# Patient Record
Sex: Male | Born: 1950 | Race: White | Hispanic: No | Smoking: Current every day smoker
Health system: Southern US, Community
[De-identification: ages and names within clinical notes are randomized; demographics above are authoritative.]

## PROBLEM LIST (undated history)

## (undated) DIAGNOSIS — Z86010 Personal history of colonic polyps: Secondary | ICD-10-CM

## (undated) DIAGNOSIS — Z860101 Personal history of adenomatous and serrated colon polyps: Secondary | ICD-10-CM

## (undated) DIAGNOSIS — S022XXA Fracture of nasal bones, initial encounter for closed fracture: Secondary | ICD-10-CM

## (undated) DIAGNOSIS — I10 Essential (primary) hypertension: Secondary | ICD-10-CM

## (undated) DIAGNOSIS — I514 Myocarditis, unspecified: Secondary | ICD-10-CM

## (undated) DIAGNOSIS — N400 Enlarged prostate without lower urinary tract symptoms: Secondary | ICD-10-CM

## (undated) DIAGNOSIS — K602 Anal fissure, unspecified: Secondary | ICD-10-CM

## (undated) DIAGNOSIS — E785 Hyperlipidemia, unspecified: Secondary | ICD-10-CM

## (undated) DIAGNOSIS — F17201 Nicotine dependence, unspecified, in remission: Secondary | ICD-10-CM

## (undated) HISTORY — DX: Personal history of colonic polyps: Z86.010

## (undated) HISTORY — DX: Nicotine dependence, unspecified, in remission: F17.201

## (undated) HISTORY — DX: Fracture of nasal bones, initial encounter for closed fracture: S02.2XXA

## (undated) HISTORY — DX: Hyperlipidemia, unspecified: E78.5

## (undated) HISTORY — PX: BUNIONECTOMY: SHX129

## (undated) HISTORY — DX: Essential (primary) hypertension: I10

## (undated) HISTORY — DX: Personal history of adenomatous and serrated colon polyps: Z86.0101

## (undated) HISTORY — PX: RHINOPLASTY: SUR1284

## (undated) HISTORY — DX: Myocarditis, unspecified: I51.4

## (undated) HISTORY — PX: APPENDECTOMY: SHX54

## (undated) HISTORY — DX: Benign prostatic hyperplasia without lower urinary tract symptoms: N40.0

---

## 1971-09-15 DIAGNOSIS — I219 Acute myocardial infarction, unspecified: Secondary | ICD-10-CM

## 1971-09-15 HISTORY — DX: Acute myocardial infarction, unspecified: I21.9

## 1991-09-15 DIAGNOSIS — I219 Acute myocardial infarction, unspecified: Secondary | ICD-10-CM

## 1991-09-15 HISTORY — DX: Acute myocardial infarction, unspecified: I21.9

## 2006-06-30 ENCOUNTER — Ambulatory Visit: Payer: Self-pay | Admitting: Internal Medicine

## 2006-06-30 ENCOUNTER — Ambulatory Visit (HOSPITAL_COMMUNITY): Admission: RE | Admit: 2006-06-30 | Discharge: 2006-06-30 | Payer: Self-pay | Admitting: Internal Medicine

## 2006-06-30 ENCOUNTER — Encounter (INDEPENDENT_AMBULATORY_CARE_PROVIDER_SITE_OTHER): Payer: Self-pay | Admitting: *Deleted

## 2006-06-30 HISTORY — PX: COLONOSCOPY: SHX174

## 2007-04-26 ENCOUNTER — Ambulatory Visit: Payer: Self-pay | Admitting: Cardiovascular Disease

## 2007-05-03 ENCOUNTER — Ambulatory Visit: Payer: Self-pay | Admitting: Cardiovascular Disease

## 2007-05-03 ENCOUNTER — Encounter (HOSPITAL_COMMUNITY): Admission: RE | Admit: 2007-05-03 | Discharge: 2007-06-02 | Payer: Self-pay | Admitting: Cardiovascular Disease

## 2007-05-03 ENCOUNTER — Encounter: Payer: Self-pay | Admitting: Cardiology

## 2007-05-13 ENCOUNTER — Ambulatory Visit: Payer: Self-pay | Admitting: Cardiovascular Disease

## 2007-05-13 ENCOUNTER — Ambulatory Visit (HOSPITAL_COMMUNITY): Admission: RE | Admit: 2007-05-13 | Discharge: 2007-05-13 | Payer: Self-pay | Admitting: Cardiovascular Disease

## 2009-05-03 ENCOUNTER — Ambulatory Visit: Payer: Self-pay | Admitting: Cardiology

## 2009-05-03 DIAGNOSIS — I514 Myocarditis, unspecified: Secondary | ICD-10-CM | POA: Insufficient documentation

## 2009-05-03 DIAGNOSIS — E785 Hyperlipidemia, unspecified: Secondary | ICD-10-CM

## 2009-05-03 DIAGNOSIS — F17201 Nicotine dependence, unspecified, in remission: Secondary | ICD-10-CM | POA: Insufficient documentation

## 2009-06-24 ENCOUNTER — Telehealth (INDEPENDENT_AMBULATORY_CARE_PROVIDER_SITE_OTHER): Payer: Self-pay | Admitting: *Deleted

## 2009-08-15 ENCOUNTER — Ambulatory Visit (HOSPITAL_COMMUNITY): Admission: RE | Admit: 2009-08-15 | Discharge: 2009-08-15 | Payer: Self-pay | Admitting: Family Medicine

## 2009-10-02 DIAGNOSIS — M5090 Cervical disc disorder, unspecified, unspecified cervical region: Secondary | ICD-10-CM

## 2010-01-09 ENCOUNTER — Encounter (INDEPENDENT_AMBULATORY_CARE_PROVIDER_SITE_OTHER): Payer: Self-pay | Admitting: *Deleted

## 2010-01-09 LAB — CONVERTED CEMR LAB
Albumin: 4.2 g/dL
Alkaline Phosphatase: 40 units/L
BUN: 15 mg/dL
Basophils Absolute: 0 10*3/uL
Chloride: 104 meq/L
Creatinine, Ser: 0.96 mg/dL
Eosinophils Absolute: 0.1 10*3/uL
Eosinophils Relative: 2 %
Free T4: 6.5 ng/dL
Glucose, Bld: 98 mg/dL
Hemoglobin: 13.2 g/dL
Lymphs Abs: 1.6 10*3/uL
MCHC: 33.5 g/dL
MCV: 90.2 fL
Monocytes Absolute: 0.5 10*3/uL
Potassium: 3.8 meq/L
RBC: 4.37 M/uL
Total Protein: 6.4 g/dL
WBC: 6 10*3/uL

## 2010-09-19 ENCOUNTER — Encounter (INDEPENDENT_AMBULATORY_CARE_PROVIDER_SITE_OTHER): Payer: Self-pay | Admitting: *Deleted

## 2010-09-19 ENCOUNTER — Ambulatory Visit
Admission: RE | Admit: 2010-09-19 | Discharge: 2010-09-19 | Payer: Self-pay | Source: Home / Self Care | Attending: Cardiology | Admitting: Cardiology

## 2010-09-24 ENCOUNTER — Encounter (INDEPENDENT_AMBULATORY_CARE_PROVIDER_SITE_OTHER): Payer: Self-pay | Admitting: *Deleted

## 2010-09-24 LAB — CONVERTED CEMR LAB
Cholesterol: 171 mg/dL
HDL: 48 mg/dL
Triglycerides: 126 mg/dL

## 2010-10-02 ENCOUNTER — Telehealth (INDEPENDENT_AMBULATORY_CARE_PROVIDER_SITE_OTHER): Payer: Self-pay | Admitting: *Deleted

## 2010-10-02 ENCOUNTER — Encounter (INDEPENDENT_AMBULATORY_CARE_PROVIDER_SITE_OTHER): Payer: Self-pay | Admitting: *Deleted

## 2010-10-14 ENCOUNTER — Telehealth (INDEPENDENT_AMBULATORY_CARE_PROVIDER_SITE_OTHER): Payer: Self-pay | Admitting: *Deleted

## 2010-10-16 NOTE — Assessment & Plan Note (Signed)
Summary: F1Y   Visit Type:  Follow-up Primary Provider:  Dr. Kari Baars   History of Present Illness: [name] returns to the office for continued assessment and treatment of hyperlipidemia.  Since his last visit, he has done generally well from a cardiovascular standpoint.  He denies orthopnea, PND, lightheadedness, syncope or chest pain.  He has developed dyspnea on exertion, but attributes that to weight gain and physical inactivity.  He underwent right foot and subsequently left foot surgery by a podiatrist, which has precluded any significant exercise for a period of months.  He has only recently recovered from those 2 procedures.  Current Medications (verified): 1)  Simvastatin 20 Mg Tabs (Simvastatin) .... Take 1 Tab Once Daily 2)  Aspirin 81 Mg Tabs (Aspirin) .... Once Daily 3)  Multivitamins  Tabs (Multiple Vitamin) .... Once Daily 4)  Flomax 0.4 Mg Caps (Tamsulosin Hcl) .... Once Daily 5)  Ibuprofen 200 Mg Tabs (Ibuprofen) .... Take As Needed  Allergies (verified): No Known Drug Allergies  Comments:  Nurse/Medical Assistant: patient brought med list [facility] [place]  Past History:  PMH, FH, and Social History reviewed and updated.  Review of Systems       See history of present illness.  Vital Signs:  Patient profile:   60 year old male Weight:      227 pounds BMI:     32.69 O2 Sat:      93 % on Room air Pulse rate:   64 / minute BP sitting:   153 / 91  (left arm)  Vitals Entered By: Dreama Saa, CNA (September 19, 2010 1:47 PM)  O2 Flow:  Room air  Physical Exam  General:  Moderately overweight; well-developed; no acute distress;    Neck: No JVD; no carotid bruits;  Lungs: No tachypnea, clear without rales, rhonchi or wheezes;  Cardiovascular: Distant S1 and S2 Abdomen: BS normal; soft and non-tender without masses or organomegaly;  Musculoskeletal: No deformities, no cyanosis or clubbing;    Neurologic: Normal cranial nerves;  symmetric strength and tone;  Skin:  Warm, no significant lesions;  Extremities: Normal distal pulses; trace edema.      Impression & Recommendations:  Problem # 1:  HYPERLIPIDEMIA (ICD-272.4) Lipids have not been assessed since he was switched from atorvastatin to simvastatin.  A profile will be obtained.  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  His myocarditis appears to have resolved completely, and no further testing or treatment for same is warranted.  Other healthcare maintenance items were reviewed.  He has had a number of colonoscopies with a history of adenomatous polyps and is due for a repeat procedure in approximately one year.  He receives influenza vaccine annually.  I will plan to reassess this nice gentleman in one year.  Other Orders: Future Orders: T-Lipid Profile ([id number]) ... 09/22/2010  Patient Instructions: 1)  Your physician recommends that you schedule a follow-up appointment in: 1 YEAR 2)  Your physician recommends that you return for a FASTING lipid profile: 1 WEEK 3)  Your physician discussed the importance of regular exercise and recommended that you start or continue a regular exercise program for good health.

## 2010-10-16 NOTE — Progress Notes (Signed)
Summary: LAB RESULTS  Phone Note Call from Patient Call back at Home Phone [phone]   Caller: PT Reason for Call: Talk to Nurse, Lab or Test Results Summary of Call: LABS Initial call taken by: Faythe Ghee,  October 02, 2010 3:21 PM  Follow-up for Phone Call        labs were sent out to Lab 1 [phone] labs to be faxed today Follow-up by: Teressa Lower RN,  October 02, 2010 4:23 PM  Additional Follow-up for Phone Call Additional follow up Details #1::        results given to pt. Additional Follow-up by: Teressa Lower RN,  October 03, 2010 10:40 AM

## 2010-10-16 NOTE — Miscellaneous (Signed)
Summary: cxr 08/15/2009,nuclear 05/03/2007  Clinical Lists Changes  Observations: Added new observation of CXR RESULTS:  Clinical Data: Cough, shortness of breath    CHEST - 2 VIEW    Comparison: None    Findings:   Cardiac enlargement.   Normal mediastinal contours and pulmonary vascularity.   Minimal atelectasis at left base.   Lungs otherwise clear.   Bones unremarkable.    IMPRESSION:   Cardiomegaly with minimal left base atelectasis.    Read By:  Lollie Marrow,  M.D.   Released By:  Lollie Marrow,  M.D.  Additional Information  HL7 RESULT STATUS : F (08/15/2009 8:04) Added new observation of NUCLEAR NOS:  Clinical Data:  60 year old patient with history of myocarditis.   Question non-epicardial coronary artery disease with spasm.  Study   was done to rule out ischemia and assess for infarct.   STRESS MYOVIEW:   The patient exercised for 11 minutes on the standard Bruce protocol.   Exercise was stopped due to fatigue.  Maximum heart rate was under   69.  Peak blood pressure was 170/90.  EKG was normal.  There was no   ischemia or stress.   SCINTIGRAPHIC RESULTS:  Images were reconstructed in the short axis,   vertical, and horizontal long axes.  Resting stress images were   normal.  There was no evidence of ischemia or infarction.   IMPRESSION:   Normal stress Myoview with good exercise tolerance.  Ejection   fraction was 56%.    Read By:  Wendall Stade,  M.D.   Released By:  Wendall Stade,  M.D.  Additional Information (05/03/2007 8:05)      CXR  Procedure date:  08/15/2009  Findings:       Clinical Data: Cough, shortness of breath    CHEST - 2 VIEW    Comparison: None    Findings:   Cardiac enlargement.   Normal mediastinal contours and pulmonary vascularity.   Minimal atelectasis at left base.   Lungs otherwise clear.   Bones unremarkable.    IMPRESSION:   Cardiomegaly with minimal left base atelectasis.    Read By:  Lollie Marrow,   M.D.   Released By:  Lollie Marrow,  M.D.  Additional Information  HL7 RESULT STATUS : F  Nuclear Study  Procedure date:  05/03/2007  Findings:       Clinical Data:  60 year old patient with history of myocarditis.   Question non-epicardial coronary artery disease with spasm.  Study   was done to rule out ischemia and assess for infarct.   STRESS MYOVIEW:   The patient exercised for 11 minutes on the standard Bruce protocol.   Exercise was stopped due to fatigue.  Maximum heart rate was under   69.  Peak blood pressure was 170/90.  EKG was normal.  There was no   ischemia or stress.   SCINTIGRAPHIC RESULTS:  Images were reconstructed in the short axis,   vertical, and horizontal long axes.  Resting stress images were   normal.  There was no evidence of ischemia or infarction.   IMPRESSION:   Normal stress Myoview with good exercise tolerance.  Ejection   fraction was 56%.    Read By:  Wendall Stade,  M.D.   Released By:  Wendall Stade,  M.D.  Additional Information

## 2010-10-16 NOTE — Letter (Signed)
Summary: [place] [facility], agricultural at [employer]  [address], [place] [address]   Phone: [phone]  Fax: [phone]     September 19, 2010 MRN: [id number]   JIAIRE ROSEBROOK [address], [place]  [address]      YOUR LAB WORK IS DUE   MONDAY September 22, 2010  Please go to [facility], located across the street from [facility] on the second floor.  Hours are Monday - Friday 7am until 7:30pm         Saturday 8am until 12noon    _X_  DO NOT EAT OR DRINK AFTER MIDNIGHT EVENING PRIOR TO LABWORK

## 2010-10-16 NOTE — Miscellaneous (Signed)
Summary: LABS CMP,CBCD,T4,TSH,01/09/2010  Clinical Lists Changes  Observations: Added new observation of CALCIUM: 8.4 mg/dL (16/06/9603 5:40) Added new observation of ALBUMIN: 4.2 g/dL (98/07/9146 8:29) Added new observation of PROTEIN, TOT: 6.4 g/dL (56/21/3086 5:78) Added new observation of SGPT (ALT): 21 units/L (01/09/2010 8:08) Added new observation of SGOT (AST): 20 units/L (01/09/2010 8:08) Added new observation of ALK PHOS: 40 units/L (01/09/2010 8:08) Added new observation of GFR AA: >60 mL/min/1.74m2 (01/09/2010 8:08) Added new observation of GFR: >60 mL/min (01/09/2010 8:08) Added new observation of CREATININE: 0.96 mg/dL (46/96/2952 8:41) Added new observation of BUN: 15 mg/dL (32/44/0102 7:25) Added new observation of BG RANDOM: 98 mg/dL (36/64/4034 7:42) Added new observation of CO2 PLSM/SER: 23 meq/L (01/09/2010 8:08) Added new observation of CL SERUM: 104 meq/L (01/09/2010 8:08) Added new observation of K SERUM: 3.8 meq/L (01/09/2010 8:08) Added new observation of NA: 137 meq/L (01/09/2010 8:08) Added new observation of TSH: 1.710 microintl units/mL (01/09/2010 8:08) Added new observation of T4, FREE: 6.5 ng/dL (59/56/3875 6:43) Added new observation of ABSOLUTE BAS: 0.0 K/uL (01/09/2010 8:08) Added new observation of BASOPHIL %: 0 % (01/09/2010 8:08) Added new observation of EOS ABSLT: 0.1 K/uL (01/09/2010 8:08) Added new observation of % EOS AUTO: 2 % (01/09/2010 8:08) Added new observation of ABSOLUTE MON: 0.5 K/uL (01/09/2010 8:08) Added new observation of MONOCYTE %: 8 % (01/09/2010 8:08) Added new observation of ABS LYMPHOCY: 1.6 K/uL (01/09/2010 8:08) Added new observation of LYMPHS %: 27 % (01/09/2010 8:08) Added new observation of PLATELETK/UL: 195 K/uL (01/09/2010 8:08) Added new observation of RDW: 13.8 % (01/09/2010 8:08) Added new observation of MCHC RBC: 33.5 g/dL (32/95/1884 1:66) Added new observation of MCV: 90.2 fL (01/09/2010 8:08) Added new  observation of HCT: 39.4 % (01/09/2010 8:08) Added new observation of HGB: 13.2 g/dL (03/13/1600 0:93) Added new observation of RBC M/UL: 4.37 M/uL (01/09/2010 8:08) Added new observation of WBC COUNT: 6.0 10*3/microliter (01/09/2010 8:08)

## 2010-10-17 ENCOUNTER — Encounter (INDEPENDENT_AMBULATORY_CARE_PROVIDER_SITE_OTHER): Payer: Self-pay | Admitting: *Deleted

## 2010-10-22 NOTE — Progress Notes (Signed)
Summary: Surgical Clearance  Phone Note From Other Clinic Call back at [phone]   Caller: Dr. Ilsa Iha with [facility] Request: Talk with Nurse Summary of Call: Dr Ilsa Iha called to see if patient could be cleared for nasal and eye lid surgery under general anesthesia with  minimal blood loss. Patient was seen in Jan 2012. If cleared please fax to [phone].   Dr Ilsa Iha will be the surgeon. Initial call taken by: Faythe Ghee,  October 14, 2010 2:54 PM  Follow-up for Phone Call        S: surgical clearace for eyelid lift and rhinoplasty B: last office visit on 09/19/10, no change in meds, flp performed A: pt takes an 81mg  asa, labs were performed with med changes R: Follow-up by: Teressa Lower RN,  October 14, 2010 4:28 PM  Additional Follow-up for Phone Call Additional follow up Details #1::        [name] has no known cardiovascular disease.  His risk for the proposed surgery is age-appropriate and acceptable.  Aspirin can be held if doing so would result in a safer operation from a blood loss standpoint. Additional Follow-up by: Kathlen Brunswick, MD, [facility],  October 17, 2010 12:43 PM    Additional Follow-up for Phone Call Additional follow up Details #2::    clearance letter faxed to surgeon Follow-up by: Teressa Lower RN,  October 17, 2010 3:59 PM

## 2010-10-22 NOTE — Letter (Signed)
Summary: Clearance Letter  [facility]  [address] [place] [address]   Phone: [phone]  Fax: [phone]    October 17, 2010  Re:     Shane [name] Address:   [address] [facility] [address], [place]  [address] DOB:     09/18/1950 MRN:     [id number]   Dear Dr. Anne Hahn:   [name] has no known cardiovascular disease.  His risk for the proposed surgery is age-appropriate and acceptable.  Aspirin can be held if doing so would result in a safer operation from a blood loss standpoint.       Sincerely,  Ancil Linsey

## 2011-01-27 NOTE — Assessment & Plan Note (Signed)
[facility]                       [place] CARDIOLOGY OFFICE NOTE   Esten, Dollar TEANCUM BRULE                  MRN:          [id number]  DATE:04/26/2007                            DOB:          12-04-1950    Mr. Gianfrancesco is referred by Dr. Juanetta Gosling today for evaluation of  coronary artery disease and previous myocardial infarctions.   The patient is originally form [place].   He describes a history of what was probably myocarditis when he was age  60.  He said he had a viral infection of the heart.  He presented at  that time with chest pain.  He describes having a heart catheterization  from the right arm at that time and said he did not have blockages.  Subsequently, at the age of 65, he had some recurrent chest pain and  said he had another myocardial infarction.  At that time, it was not  clear if he had myocarditis again.  However, he describes having a heart  cath from the right leg at that time without significant coronary artery  disease.   The patient's coronary risk factors include hypercholesterolemia.  He is  a non-diabetic.   FAMILY HISTORY:  Negative for premature disease.   He has borderline hypertension as far as I can tell.  He smokes cigars  on a regular basis.   The patient is active.  He moved down here to be close to his mother.  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  He  does not get exertional chest pain, PND, or orthopnea.  There has been  no palpitations or syncope.   In regard to the patient's heart, there has been no febrile illness, no  evidence of recurrent endocarditis, SBE, or myocarditis.   He has not had any recent URI or viral illnesses.   His last stress test was 2-1/2 years ago.  He said he had a thallium  study and it looked fine.   REVIEW OF SYSTEMS:  Otherwise, negative.   PAST MEDICAL HISTORY:  Otherwise remarkable for a history of colonic  polyps,  hyperlipidemia, previous myocarditis or myocardial infarction,  not related to epicardial or coronary disease.  He has had a previous  appendectomy and previous broken nose.   He denies any allergies.   FAMILY HISTORY:  Noncontributory.  There is no premature coronary artery  disease.  His father died of pancreatic cancer in his 28s.  His mother  is still alive.   MEDICATIONS:  1. Include an aspirin a day.  2. Lipitor 10 a day.  3. Niaspan 500 a day.  4. Multivitamins.   The patient has been married twice.  His second wife is actually still  up in the [place] area as he is trying to sell a house up there.  He  has 2 grown children, 21 and 31, who are doing well.  He works at the  post office and is otherwise fairly sedentary.  He drinks 3 to 5 glasses  of wine per week and smokes cigars.   EXAM:  Remarkable for a  healthy-appearing middle-aged [place] man in no  distress.  Affect is appropriate.  Blood pressure is 130/75, pulse 70 and regular.  He is afebrile.  Respiratory rate is 12.  HEENT:  Normal.  There are no carotid bruits.  No lymphadenopathy.  No  thyromegaly.  No JVP elevation.  LUNGS:  Clear with good diaphragmatic motion.  No wheezing.  There was an S1, S2 with normal heart sounds.  PMI is normal.  ABDOMEN:  Benign.  Bowel sounds positive.  No tenderness.  No AAA.  No  hepatosplenomegaly.  No hepatojugular reflux.  He is status post  appendectomy.  Femorals are +3 bilaterally without bruit.  Distal pulses  are intact.  PT +2.  There is no edema.  NEURO:  Nonfocal.  There is no muscular weakness.  Baseline EKG is  totally normal.   IMPRESSION:  1. History of myocarditis at a young age.  I suspect that this was      self-limited and may have been more related to endocarditis.  There      does not appear to be any recurrence or any connective tissue      disease at baseline.  2. Question second myocardial infarction at the age of 69.  No      documented history  of spasm or embolic event.  The patient is      currently not having chest pain.  However, I think it is important      to get to the bottom of this as to whether he has actually ever had      any myocardial damage.  He will be referred for a cardiac MRI with      viability and hyper-enhancement study.  I would also like to get      the patient on a treadmill and do a stress Myoview study to see      what his blood pressure response is to exercise, and make sure      there is no ischemia by stress testing.  3. Hyperlipidemia.  Check lipid and liver profile in 6 months.      Continue Lipitor.  4. Hypertriglyceridemia.  Diet-controlled.  Low-fat diet with Niaspan      500 mg daily.  The patient is not having any problems in regards to      flushing.  5. Health maintenance.  The patient has significant history of polyps.      He probably needs a followup colonoscopy in the next year.  He is      not having any bleeding.  He will follow up with Dr. Juanetta Gosling for      this.  6. Prostatism.  Flomax started recently.  No significant drop in blood      pressure.  No precipitation of angina.  Followup PSA with Dr.      Juanetta Gosling.  7. Overall, I think it is encouraging that the patient's EKG is      normal.  I suspect that his MRI will not show any infarct or scar      tissue, and hopefully his stress test will be normal.  8. Smoking cessation.  The patient is really not motivated to stop      smoking.  He believes      cigars are not as bad as cigarettes.  He will call me if at any      time he wants to consider nicotine replacement, Wellbutrin, or      Chantix.  Noralyn Pick. Eden Emms, MD, [facility]  Electronically Signed    PCN/MedQ  DD: 04/26/2007  DT: 04/27/2007  Job #: [id number]   cc:   Ramon Dredge L. Juanetta Gosling, M.D.

## 2011-01-30 NOTE — Op Note (Signed)
NAME:  Shane [name], Shane [name]           ACCOUNT NO.:  [id number]   MEDICAL RECORD NO.:  [id number]          PATIENT TYPE:  AMB   LOCATION:  DAY                           FACILITY:  [facility]   PHYSICIAN:  R. Roetta Sessions, M.D. DATE OF BIRTH:  [birth year]   DATE OF PROCEDURE:  06/30/2006  DATE OF DISCHARGE:                                 OPERATIVE REPORT   PROCEDURE:  Colonoscopy and snare polypectomy.   INDICATIONS FOR PROCEDURE:  The patient is a 60 year old gentleman with a  history of colonic polyps.  He has had multiple colonoscopies in [place];  the last one was 3-1/2 years ago.  He states he had polyps every time he had  a colonoscopy.  He has no lower GI tract symptoms at this time, and there is  no family history of colorectal neoplasia.  Colonoscopy is now being done as  a surveillance maneuver.  This approach has been discussed with the patient  at length.  The potential risks, benefits, and alternatives have been  reviewed.  Questions answered.  He is agreeable.  Please see documentation  in the medical record.   PROCEDURE NOTE:  O2 saturation, blood pressure, pulse oximetry were  monitored throughout the entire procedure.   CONSCIOUS SEDATION:  Versed 3 mg IV, Demerol 75 mg IV in divided doses.   INSTRUMENT:  Olympus video chip system.   FINDINGS:  Digital exam revealed no abnormalities.  Endoscopic findings:  Prep was good.  Rectum:  Examination of the rectum mucosa including  retroflexion at the anal verge revealed no abnormalities.   Colon:  Colonic mucosa was surveyed from the rectosigmoid junction, through  the left, transverse, and right colon, to the appendiceal remnant, ileocecal  valve, and cecum.  These structures were well seen and photographed for the  record.  From this level, the scope was slowly and cautiously withdrawn.  All previously mentioned mucosal surfaces were again seen.  The patient had  left-sided and transverse diverticula.   The patient also  had a 6 mm fleshy pedunculated polyp immediately adjacent  to a diverticulum at the junction between descending and sigmoid colon.  Please see photos.  The remainder of the colonic mucosa appeared normal.  This polyp was quite friable, and just touching it with the scope/snare  catheter produced rather brisk bleeding.  It was difficult to get at , on a  flexure partially obsured in between 2 haustra.  This did not appear to be  an inverted diverticula, nor did it appear to be a submucosal lesion.  It  appeared to be a pedunculated polyp.  It was a very difficult approach, as  it was right in a flexure, and I had an unstable position with the scope.  I  engaged it with the snare.   Plans were to do a snare cautery removal.  However, it was cold snared.  I  reapproached this lesion and examined it and noted it was only partially  removed.  I engaged it with the snare once again.  Remaining polypoid tissue  was taken off with cold technique.  Subsequently, the oozing base  was  treated with a couple applications of the tip of the snare cautery unit.  Subsequently, 2 cc of 1:10,000 epinephrine was injected submucosally, and a  nice cushion of epinephrine was noted under the polypectomy site, and there  was excellent hemostasis with this maneuver.   The patient tolerated the procedure well and was reactivated.   ENDOSCOPIC IMPRESSION:  1. Normal rectum.  2. Left-sided transverse diverticula.  3. Pedunculated polyp in the left colon at the junction between descending      and sigmoid colon, removed as described above.   RECOMMENDATIONS:  1. Absolutely no aspirin or arthritis medications for the next 10 days.  2. Diverticulosis literature provided to Mr. Routson.  3. Follow up on pathology.  4. Further recommendations to follow.      Jonathon Bellows, M.D.  Electronically Signed     RMR/MEDQ  D:  06/30/2006  T:  07/01/2006  Job:  [id number]   cc:   Ramon Dredge L. Juanetta Gosling, M.D.  Fax:  [phone]

## 2011-06-15 ENCOUNTER — Encounter: Payer: Self-pay | Admitting: Internal Medicine

## 2011-08-10 ENCOUNTER — Encounter: Payer: Self-pay | Admitting: Gastroenterology

## 2011-08-11 ENCOUNTER — Ambulatory Visit (INDEPENDENT_AMBULATORY_CARE_PROVIDER_SITE_OTHER): Payer: PRIVATE HEALTH INSURANCE | Admitting: Gastroenterology

## 2011-08-11 ENCOUNTER — Encounter: Payer: Self-pay | Admitting: Gastroenterology

## 2011-08-11 VITALS — BP 136/92 | HR 100 | Temp 98.3°F | Ht 69.0 in | Wt 200.8 lb

## 2011-08-11 DIAGNOSIS — Z86010 Personal history of colonic polyps: Secondary | ICD-10-CM

## 2011-08-11 MED ORDER — PEG-KCL-NACL-NASULF-NA ASC-C 100 G PO SOLR: 1.0000 | Freq: Once | ORAL | Status: DC

## 2011-08-11 NOTE — Assessment & Plan Note (Signed)
60 year old male with hx of adenomatous polyp in remote past, s/p multiple colonoscopies in [place] and most recent in October 2007 through our office. He has no lower GI symptoms, nor any family history of colorectal cancer. We will be proceeding with a surveillance colonoscopy in the near future, and he is agreeable to this.  Proceed with TCS with Dr. Jena Gauss in near future: the risks, benefits, and alternatives have been discussed with the patient in detail. The patient states understanding and desires to proceed.

## 2011-08-11 NOTE — Patient Instructions (Signed)
We have set you up for a colonoscopy with Dr. Jena Gauss.  No changes to your medications.  Further recommendations to follow soon!

## 2011-08-11 NOTE — Progress Notes (Signed)
Primary Care Physician:  Kirk Ruths, MD Primary Gastroenterologist:  Dr. Jena Gauss  Chief Complaint  Patient presents with  . Colonoscopy    HPI:   Shane [name] is a 60 year old male who presents today for surveillance colonoscopy. He has a history of multiple colonoscopies in the remote past in [place], with reports of adenomatous polyp in past. His last colonoscopy was in October 2007 by our office, with left-sided transverse diverticula, pedunculated polyp requiring cold snare removal. It oozed  Requiring 1 cc of epi with good hemostasis achieved. Pathology revealed acute and chronically inflamed granulation tissue with surface ulceration. He is completely void of any lower GI symptoms. No abdominal pain. No N/V. No blood in stool. No change in bowel habits, no wt loss or lack of appetite. No FH of colon ca.   Past Medical History  Diagnosis Date  . Dyslipidemia   . Tobacco user     cigars x2 a day  . Nasal fracture     S/P   . Myocarditis   . Prostatic hypertrophy, benign   . Hypertension   . Heart attack     X2  . Hx of adenomatous colonic polyps     per pt report many years ago    Past Surgical History  Procedure Date  . Colonoscopy 06/30/2006    normal rectum/left-side transverse diverticula, pedunculated polyp in left colon,  path with acutely and chronically inflammed granulation tissue with surface ulceration  . Appendectomy   . Rhinoplasty   . Bunion removals     Current Outpatient Prescriptions  Medication Sig Dispense Refill  . ALPRAZolam (XANAX) 0.5 MG tablet Take 0.5 mg by mouth at bedtime as needed.       Marland Kitchen aspirin 81 MG tablet Take 81 mg by mouth daily.        Marland Kitchen losartan (COZAAR) 50 MG tablet Take 50 mg by mouth daily.       . Multiple Vitamin (MULTIVITAMIN) capsule Take 1 capsule by mouth daily.        . simvastatin (ZOCOR) 20 MG tablet Take 10 mg by mouth at bedtime.       . peg 3350 powder (MOVIPREP) 100 G SOLR Take 1 kit (100 g total) by mouth  once. As directed Please purchase 1 Fleets enema to use with the prep  1 kit  0    Allergies as of 08/11/2011  . (No Known Allergies)    Family History  Problem Relation Age of Onset  . Heart attack Father   . Colon cancer Neg Hx     History   Social History  . Marital Status: Married    Spouse Name: N/A    Number of Children: N/A  . Years of Education: N/A   Occupational History  . retired     post office   Social History Main Topics  . Smoking status: Current Some Day Smoker    Types: Cigars  . Smokeless tobacco: Not on file   Comment: 2 a week  . Alcohol Use: Yes     a glass of wine every night and glass of bourbon  . Drug Use: No  . Sexually Active: Not on file   Other Topics Concern  . Not on file   Social History Narrative  . No narrative on file    Review of Systems: Gen: Denies any fever, chills, fatigue, weight loss, lack of appetite.  CV: Denies chest pain, heart palpitations, peripheral edema, syncope.  Resp: Denies shortness  of breath at rest or with exertion. Denies wheezing or cough.  GI: SEE HPI GU : Denies urinary burning, urinary frequency, urinary hesitancy MS: Denies joint pain, muscle weakness, cramps, or limitation of movement.  Derm: Denies rash, itching, dry skin Psych: Denies depression, anxiety, memory loss, and confusion Heme: Denies bruising, bleeding, and enlarged lymph nodes.  Physical Exam: BP 136/92  Pulse 100  Temp(Src) 98.3 F (36.8 C) (Temporal)  Ht 5\' 9"  (1.753 m)  Wt 200 lb 12.8 oz (91.082 kg)  BMI 29.65 kg/m2 General:   Alert and oriented. Pleasant and cooperative. Well-nourished and well-developed.  Head:  Normocephalic and atraumatic. Eyes:  Without icterus, sclera clear and conjunctiva pink.  Ears:  Normal auditory acuity. Nose:  No deformity, discharge,  or lesions. Mouth:  No deformity or lesions, oral mucosa pink.  Neck:  Supple, without mass or thyromegaly. Lungs:  Clear to auscultation bilaterally. No  wheezes, rales, or rhonchi. No distress.  Heart:  S1, S2 present without murmurs appreciated.  Abdomen:  +BS, soft, non-tender and non-distended. No HSM noted. No guarding or rebound. No masses appreciated.  Rectal:  Deferred  Msk:  Symmetrical without gross deformities. Normal posture. Extremities:  Without clubbing or edema. Neurologic:  Alert and  oriented x4;  grossly normal neurologically. Skin:  Intact without significant lesions or rashes. Cervical Nodes:  No significant cervical adenopathy. Psych:  Alert and cooperative. Normal mood and affect.

## 2011-08-12 NOTE — Progress Notes (Signed)
Cc to PCP 

## 2011-08-20 ENCOUNTER — Encounter (HOSPITAL_COMMUNITY): Payer: Self-pay | Admitting: Pharmacy Technician

## 2011-08-31 MED ORDER — SODIUM CHLORIDE 0.45 % IV SOLN
Freq: Once | INTRAVENOUS | Status: AC
  Administered 2011-09-01: 12:00:00 via INTRAVENOUS

## 2011-09-01 ENCOUNTER — Encounter (HOSPITAL_COMMUNITY): Payer: Self-pay | Admitting: *Deleted

## 2011-09-01 ENCOUNTER — Ambulatory Visit (HOSPITAL_COMMUNITY)
Admission: RE | Admit: 2011-09-01 | Discharge: 2011-09-01 | Disposition: A | Discharge status: Home / Self Care | Payer: PRIVATE HEALTH INSURANCE | Source: Ambulatory Visit | Attending: Internal Medicine | Admitting: Internal Medicine

## 2011-09-01 ENCOUNTER — Encounter (HOSPITAL_COMMUNITY)
Admission: RE | Disposition: A | Discharge status: Home / Self Care | Payer: Self-pay | Source: Ambulatory Visit | Attending: Internal Medicine

## 2011-09-01 DIAGNOSIS — Z1211 Encounter for screening for malignant neoplasm of colon: Secondary | ICD-10-CM

## 2011-09-01 DIAGNOSIS — K5730 Diverticulosis of large intestine without perforation or abscess without bleeding: Secondary | ICD-10-CM | POA: Insufficient documentation

## 2011-09-01 DIAGNOSIS — Z860100 Personal history of colon polyps, unspecified: Secondary | ICD-10-CM | POA: Insufficient documentation

## 2011-09-01 DIAGNOSIS — Z79899 Other long term (current) drug therapy: Secondary | ICD-10-CM | POA: Insufficient documentation

## 2011-09-01 DIAGNOSIS — I10 Essential (primary) hypertension: Secondary | ICD-10-CM | POA: Insufficient documentation

## 2011-09-01 DIAGNOSIS — E785 Hyperlipidemia, unspecified: Secondary | ICD-10-CM | POA: Insufficient documentation

## 2011-09-01 HISTORY — PX: COLONOSCOPY: SHX5424

## 2011-09-01 SURGERY — COLONOSCOPY
Anesthesia: Moderate Sedation

## 2011-09-01 MED ORDER — MEPERIDINE HCL 100 MG/ML IJ SOLN
INTRAMUSCULAR | Status: AC
  Filled 2011-09-01: qty 2

## 2011-09-01 MED ORDER — MIDAZOLAM HCL 5 MG/5ML IJ SOLN
INTRAMUSCULAR | Status: DC | PRN
  Administered 2011-09-01: 2 mg via INTRAVENOUS
  Administered 2011-09-01: 1 mg via INTRAVENOUS
  Administered 2011-09-01: 2 mg via INTRAVENOUS

## 2011-09-01 MED ORDER — MIDAZOLAM HCL 5 MG/5ML IJ SOLN
INTRAMUSCULAR | Status: AC
  Filled 2011-09-01: qty 10

## 2011-09-01 MED ORDER — STERILE WATER FOR IRRIGATION IR SOLN
Status: DC | PRN
  Administered 2011-09-01: 14:00:00

## 2011-09-01 MED ORDER — MEPERIDINE HCL 100 MG/ML IJ SOLN
INTRAMUSCULAR | Status: DC | PRN
  Administered 2011-09-01 (×2): 50 mg via INTRAVENOUS

## 2011-09-01 NOTE — H&P (Signed)
  I have seen & examined the patient prior to the procedure(s) today and reviewed the history and physical/consultation.  There have been no changes.  After consideration of the risks, benefits, alternatives and imponderables, the patient has consented to the procedure(s).   

## 2011-09-01 NOTE — Op Note (Signed)
[facility] [address] [place], [place]  [address]  COLONOSCOPY PROCEDURE REPORT  PATIENT:  [name], Shane  MR#:  [id number] BIRTHDATE:  1951-02-19, 60 yrs. old  GENDER:  male ENDOSCOPIST:  R. Roetta Sessions, MD FACP [facility] REF. BY:          Dr. Yetta Numbers PROCEDURE DATE:  09/01/2011 PROCEDURE:   Surveillance colonoscopy  INDICATIONS:   Distant history of colonic adenoma  INFORMED CONSENT:  The risks, benefits, alternatives and imponderables including but not limited to bleeding, perforation as well as the possibility of a missed lesion have been reviewed. The potential for biopsy, lesion removal, etc. have also been discussed.  Questions have been answered.  All parties agreeable. Please see the history and physical in the medical record for more information.  MEDICATIONS:   Versed 8 mg IV and Demerol 100 mg IV in divided doses.  DESCRIPTION OF PROCEDURE:  After a digital rectal exam was performed, the EC-[id number] ([id number]) colonoscope was advanced from the anus through the rectum and colon to the area of the cecum, ileocecal valve and appendiceal orifice.  The cecum was deeply intubated.  These structures were well-seen and photographed for the record.  From the level of the cecum and ileocecal valve, the scope was slowly and cautiously withdrawn.  The mucosal surfaces were carefully surveyed utilizing scope tip deflection to facilitate fold flattening as needed.  The scope was pulled down into the rectum where a thorough examination including retroflexion was performed. <<PROCEDUREIMAGES>>  FINDINGS: adequate preparation. Scattered pancolonic diverticula; remainder of the colonic and rectal mucosa appeared normal  THERAPEUTIC / DIAGNOSTIC MANEUVERS PERFORMED: none  COMPLICATIONS:   none  CECAL WITHDRAWAL TIME: 7 minutes  IMPRESSION: colonic diverticulosis  RECOMMENDATIONS:   Repeat colonoscopy in 5 years  ______________________________ R. Roetta Sessions, MD Caleen Essex  CC:  Karleen Hampshire, MD cc Dr. Juanetta Gosling  n. eSIGNED:   R. Roetta Sessions at 09/01/2011 02:11 PM  [name], [phone]

## 2011-09-14 ENCOUNTER — Encounter (HOSPITAL_COMMUNITY): Payer: Self-pay | Admitting: Internal Medicine

## 2011-10-09 ENCOUNTER — Encounter: Payer: Self-pay | Admitting: Cardiology

## 2011-10-09 ENCOUNTER — Encounter: Payer: Self-pay | Admitting: *Deleted

## 2011-10-09 ENCOUNTER — Ambulatory Visit (INDEPENDENT_AMBULATORY_CARE_PROVIDER_SITE_OTHER): Payer: PRIVATE HEALTH INSURANCE | Admitting: Cardiology

## 2011-10-09 DIAGNOSIS — F17200 Nicotine dependence, unspecified, uncomplicated: Secondary | ICD-10-CM

## 2011-10-09 DIAGNOSIS — I10 Essential (primary) hypertension: Secondary | ICD-10-CM

## 2011-10-09 DIAGNOSIS — E785 Hyperlipidemia, unspecified: Secondary | ICD-10-CM

## 2011-10-09 DIAGNOSIS — I514 Myocarditis, unspecified: Secondary | ICD-10-CM

## 2011-10-09 DIAGNOSIS — M5090 Cervical disc disorder, unspecified, unspecified cervical region: Secondary | ICD-10-CM

## 2011-10-09 MED ORDER — SIMVASTATIN 40 MG PO TABS: ORAL_TABLET | ORAL | Status: DC

## 2011-10-09 NOTE — Patient Instructions (Signed)
Your physician recommends that you schedule a follow-up appointment in: 12 months  Your physician has recommended you make the following change in your medication:  CHANGE Simvastatin to 40 mg 1/2 tablet each evening  Your physician recommends that you return for lab work in: Cholesterol in 6 months (you will receive a letter)  2 gram Salt diet (information included)

## 2011-10-09 NOTE — Assessment & Plan Note (Signed)
Patient has been told that recent lipid profile showed total cholesterol 199 and triglycerides of 240.  There is been some deterioration in lipid levels despite weight loss.  Simvastatin dose will be increased to 20 mg per day with a repeat lipid profile in 6 months.  Continued weight loss is recommended and information was provided on the DASH diet.  The patient fits the definition of the Metabolic Syndrome, but management of the individual components is now appropriate.

## 2011-10-09 NOTE — Assessment & Plan Note (Signed)
Myopathic process appears to have resolved many years ago.

## 2011-10-09 NOTE — Progress Notes (Signed)
Patient ID: Shane [name], male   DOB: May 31, 1951, 61 y.o.   MRN: [id number] HPI: Scheduled return visit for this very nice gentleman, who is the son of another of my patients, and is followed for management of hyperlipidemia and hypertension.  Since last visit, he has restricted caloric intake and exercise resulting in a 20 pound weight loss.  Recent laboratory studies were reported to him as being good.  He has developed mild hypertension requiring initiation of antihypertensive therapy.  Prior to Admission medications   Medication Sig Start Date End Date Taking? Authorizing Provider  ALPRAZolam Prudy Feeler) 0.5 MG tablet Take 0.5 mg by mouth at bedtime as needed. anxiety 08/03/11  Yes Historical Provider, MD  aspirin EC 81 MG tablet Take 162 mg by mouth daily.     Yes Historical Provider, MD  losartan-hydrochlorothiazide (HYZAAR) 100-25 MG per tablet Take 1 tablet by mouth Daily. 09/11/11  Yes Historical Provider, MD  metroNIDAZOLE (METROGEL) 1 % gel Apply topically daily.   Yes Historical Provider, MD  Multiple Vitamin (MULTIVITAMIN) capsule Take 1 capsule by mouth daily.     Yes Historical Provider, MD  polycarbophil (FIBERCON) 625 MG tablet Take 625 mg by mouth daily.     Yes Historical Provider, MD  VIAGRA 100 MG tablet Take 1 tablet by mouth Once daily as needed. For sex 06/23/11  Yes Historical Provider, MD  simvastatin (ZOCOR) 40 MG tablet Take 1/2 tablet each evening 10/09/11   Gerrit Friends. Dietrich Pates, MD  No Known Allergies    Past medical history, social history, and family history reviewed and updated.  ROS: Denies orthopnea, PND, pedal edema, palpitations, dyspnea, chest discomfort, lightheadedness or syncope.  PHYSICAL EXAM: BP 130/87  Pulse 82  Ht 5' 9.5" (1.765 m)  Wt 94.802 kg (209 lb)  BMI 30.42 kg/m2   General-Well developed; no acute distress Body habitus-moderately overweight  Neck-No JVD; no carotid bruits Lungs-clear lung fields; resonant to  percussion Cardiovascular-normal PMI; normal S1 and S2 Abdomen-normal bowel sounds; soft and non-tender without masses or organomegaly Musculoskeletal-No deformities, no cyanosis or clubbing Neurologic-Normal cranial nerves; symmetric strength and tone Skin-Warm, no significant lesions Extremities-distal pulses intact; no edema  EKG: Normal sinus rhythm; right ventricular conduction delay; borderline left atrial abnormality; voltage criteria for LVH; abnormal R wave progression.  No previous tracing available for review.  ASSESSMENT AND PLAN:  Crandall Bing, MD 10/09/2011 11:46 AM

## 2011-10-12 ENCOUNTER — Other Ambulatory Visit: Payer: Self-pay | Admitting: *Deleted

## 2011-10-12 ENCOUNTER — Encounter: Payer: Self-pay | Admitting: Cardiology

## 2011-10-12 MED ORDER — SIMVASTATIN 20 MG PO TABS: 20.0000 mg | ORAL_TABLET | Freq: Every evening | ORAL | Status: DC

## 2011-10-27 ENCOUNTER — Other Ambulatory Visit: Payer: Self-pay

## 2011-10-27 DIAGNOSIS — E782 Mixed hyperlipidemia: Secondary | ICD-10-CM

## 2011-11-04 ENCOUNTER — Telehealth: Payer: Self-pay | Admitting: Cardiology

## 2011-11-04 ENCOUNTER — Other Ambulatory Visit: Payer: Self-pay | Admitting: *Deleted

## 2011-11-04 DIAGNOSIS — E782 Mixed hyperlipidemia: Secondary | ICD-10-CM

## 2011-11-04 NOTE — Telephone Encounter (Signed)
Notified of lab results from [facility].

## 2011-11-04 NOTE — Telephone Encounter (Signed)
Patient would like results of lab work / tg  °

## 2011-11-15 ENCOUNTER — Encounter: Payer: Self-pay | Admitting: Cardiology

## 2011-11-17 ENCOUNTER — Telehealth: Payer: Self-pay | Admitting: General Practice

## 2011-11-17 NOTE — Telephone Encounter (Signed)
Patient came by with a complaint about his bill.  I was with another patient and had him leave a copy of his bill and contact number.  I tried to call the patient,no answer.lmom

## 2011-11-17 NOTE — Telephone Encounter (Signed)
I spoke with the patient about his bill.  He was told by his ins company that they didn't cover the entire claim(dos:11/27) because Janifer Adie was not in network.  I have sent an e-mail to Richard with [employer] to get som clarity on the denial.  I told the patient I would contact him tomorrow with an update.

## 2011-11-19 NOTE — Telephone Encounter (Signed)
I spoke with Shane [name] in [employer] and with Shane [name] at [employer], we came to the conclusion that the claim was not paid entirely because it was billed under Shane [name] who is not in network with them.   Shane [name] submitted a claim correction with Shane [name] as the rendering provider and [name] as the billing provider.  I informed Shane [name] of this change.

## 2012-03-29 ENCOUNTER — Other Ambulatory Visit: Payer: Self-pay | Admitting: *Deleted

## 2012-03-29 ENCOUNTER — Encounter: Payer: Self-pay | Admitting: *Deleted

## 2012-03-29 DIAGNOSIS — E782 Mixed hyperlipidemia: Secondary | ICD-10-CM

## 2012-08-26 ENCOUNTER — Other Ambulatory Visit: Payer: Self-pay | Admitting: Cardiology

## 2012-08-27 ENCOUNTER — Encounter: Payer: Self-pay | Admitting: Cardiology

## 2012-08-27 LAB — LIPID PANEL
Cholesterol: 163 mg/dL (ref 0–200)
Triglycerides: 85 mg/dL (ref <150)
VLDL: 17 mg/dL (ref 0–40)

## 2012-08-30 ENCOUNTER — Encounter: Payer: Self-pay | Admitting: *Deleted

## 2012-10-21 ENCOUNTER — Ambulatory Visit (INDEPENDENT_AMBULATORY_CARE_PROVIDER_SITE_OTHER): Payer: PRIVATE HEALTH INSURANCE | Admitting: Cardiology

## 2012-10-21 ENCOUNTER — Encounter: Payer: Self-pay | Admitting: Cardiology

## 2012-10-21 VITALS — BP 116/78 | HR 87 | Ht 70.0 in | Wt 202.0 lb

## 2012-10-21 DIAGNOSIS — E785 Hyperlipidemia, unspecified: Secondary | ICD-10-CM

## 2012-10-21 DIAGNOSIS — Z0189 Encounter for other specified special examinations: Secondary | ICD-10-CM

## 2012-10-21 DIAGNOSIS — I10 Essential (primary) hypertension: Secondary | ICD-10-CM | POA: Insufficient documentation

## 2012-10-21 MED ORDER — ATORVASTATIN CALCIUM 40 MG PO TABS: 40.0000 mg | ORAL_TABLET | Freq: Every day | ORAL | Status: DC

## 2012-10-21 NOTE — Progress Notes (Deleted)
Name: Shane [name]    DOB: Dec 29, 1950  Age: 62 y.o.  MR#: [id number]       PCP:  Kirk Ruths, MD      Insurance: @PAYORNAME @   CC:    Chief Complaint  Patient presents with  . Appointment    VS BP 116/78  Pulse 87  Ht 5\' 10"  (1.778 m)  Wt 202 lb (91.627 kg)  BMI 28.98 kg/m2  SpO2 94%  Weights Current Weight  10/21/12 202 lb (91.627 kg)  10/09/11 209 lb (94.802 kg)  08/11/11 200 lb 12.8 oz (91.082 kg)    Blood Pressure  BP Readings from Last 3 Encounters:  10/21/12 116/78  10/09/11 130/87  09/01/11 133/92     Admit date:  (Not on file) Last encounter with [facility]:  08/27/2012   Allergy Not on File  Current Outpatient Prescriptions  Medication Sig Dispense Refill  . ALPRAZolam (XANAX) 0.5 MG tablet Take 0.5 mg by mouth at bedtime as needed. anxiety      . aspirin EC 81 MG tablet Take 162 mg by mouth 2 (two) times daily.       Marland Kitchen doxycycline (ORACEA) 40 MG capsule Take 40 mg by mouth every morning.      Marland Kitchen losartan-hydrochlorothiazide (HYZAAR) 100-12.5 MG per tablet Take 1 tablet by mouth daily.      . metroNIDAZOLE (METROGEL) 1 % gel Apply topically daily.      . Multiple Vitamin (MULTIVITAMIN) capsule Take 1 capsule by mouth daily.        Marland Kitchen omeprazole (PRILOSEC) 40 MG capsule Take one tablet daily      . simvastatin (ZOCOR) 20 MG tablet Take 20 mg by mouth every evening.      Marland Kitchen VIAGRA 100 MG tablet Take 1 tablet by mouth Once daily as needed. For sex        Discontinued Meds:    Medications Discontinued During This Encounter  Medication Reason  . simvastatin (ZOCOR) 20 MG tablet Error  . losartan-hydrochlorothiazide (HYZAAR) 100-25 MG per tablet Error  . polycarbophil (FIBERCON) 625 MG tablet Error    Patient Active Problem List  Diagnosis  . Hyperlipidemia  . Tobacco abuse, in remission  . MYOCARDITIS  . CERVICAL DISC DISORDER  . History of colon polyps  . Laboratory test    LABS Orders Only on 08/26/2012  Component Date Value  . Cholesterol  08/26/2012 163   . Triglycerides 08/26/2012 85   . HDL 08/26/2012 59   . Total CHOL/HDL Ratio 08/26/2012 2.8   . VLDL 08/26/2012 17   . LDL Cholesterol 08/26/2012 87      Results for this Opt Visit:     Results for orders placed in visit on 08/26/12  LIPID PANEL      Component Value Range   Cholesterol 163  0 - 200 mg/dL   Triglycerides 85  <865 mg/dL   HDL 59  >78 mg/dL   Total CHOL/HDL Ratio 2.8     VLDL 17  0 - 40 mg/dL   LDL Cholesterol 87  0 - 99 mg/dL    EKG Orders placed in visit on 10/09/11  . EKG 12-LEAD     Prior Assessment and Plan Problem List as of 10/21/2012            Cardiology Problems   Hyperlipidemia   Last Assessment & Plan Note   10/09/2011 Office Visit Signed 10/09/2011 11:53 AM by Kathlen Brunswick, MD    Patient has been told that  recent lipid profile showed total cholesterol 199 and triglycerides of 240.  There is been some deterioration in lipid levels despite weight loss.  Simvastatin dose will be increased to 20 mg per day with a repeat lipid profile in 6 months.  Continued weight loss is recommended and information was provided on the DASH diet.  The patient fits the definition of the Metabolic Syndrome, but management of the individual components is now appropriate.    MYOCARDITIS   Last Assessment & Plan Note   10/09/2011 Office Visit Signed 10/09/2011 11:51 AM by Kathlen Brunswick, MD    Myopathic process appears to have resolved many years ago.      Other   Tobacco abuse, in remission   CERVICAL DISC DISORDER   History of colon polyps   Last Assessment & Plan Note   08/11/2011 Office Visit Signed 08/11/2011  4:14 PM by Nira Retort, NP    62 year old male with hx of adenomatous polyp in remote past, s/p multiple colonoscopies in [place] and most recent in October 2007 through our office. He has no lower GI symptoms, nor any family history of colorectal cancer. We will be proceeding with a surveillance colonoscopy in the near future, and he is  agreeable to this.  Proceed with TCS with Dr. Jena Gauss in near future: the risks, benefits, and alternatives have been discussed with the patient in detail. The patient states understanding and desires to proceed.     Laboratory test       Imaging: No results found.   FRS Calculation: Score not calculated. Missing: Total Cholesterol

## 2012-10-21 NOTE — Assessment & Plan Note (Addendum)
Blood pressure control is excellent and will improve further if patient is successful in losing the 20-30 pounds that he is anticipating. Current medication is effective and will be continued.

## 2012-10-21 NOTE — Progress Notes (Signed)
Patient ID: Shane [name], male   DOB: Apr 19, 1951, 62 y.o.   MRN: [id number]  HPI: Scheduled return visit for this nice gentleman with multiple cardiovascular risk factors but no known vascular disease. Since his last visit, he has done quite well. He is less active in the winter, but is experiencing no cardiopulmonary symptoms. He has traveled widely, having retired 3 years ago, without any physical problems. He has developed rosacea and was told that his current medication may interact with simvastatin.  Prior to Admission medications   Medication Sig Start Date End Date Taking? Authorizing Provider  ALPRAZolam Prudy Feeler) 0.5 MG tablet Take 0.5 mg by mouth at bedtime as needed. anxiety 08/03/11  Yes Historical Provider, MD  aspirin EC 81 MG tablet Take 162 mg by mouth 2 (two) times daily.    Yes Historical Provider, MD  doxycycline (ORACEA) 40 MG capsule Take 40 mg by mouth every morning.   Yes Historical Provider, MD  losartan-hydrochlorothiazide (HYZAAR) 100-12.5 MG per tablet Take 1 tablet by mouth daily.   Yes Historical Provider, MD  metroNIDAZOLE (METROGEL) 1 % gel Apply topically daily.   Yes Historical Provider, MD  Multiple Vitamin (MULTIVITAMIN) capsule Take 1 capsule by mouth daily.     Yes Historical Provider, MD  omeprazole (PRILOSEC) 40 MG capsule Take one tablet daily 10/05/12  Yes Historical Provider, MD  simvastatin (ZOCOR) 20 MG tablet Take 20 mg by mouth every evening.   Yes Historical Provider, MD  VIAGRA 100 MG tablet Take 1 tablet by mouth Once daily as needed. For sex 06/23/11  Yes Historical Provider, MD  Past medical history, social history, and family history reviewed and updated.  ROS: Denies dyspnea, orthopnea, PND, chest pain, exercise intolerance, lightheadedness or syncope. All other systems reviewed and are negative.  PHYSICAL EXAM: BP 116/78  Pulse 87  Ht 5\' 10"  (1.778 m)  Wt 91.627 kg (202 lb)  BMI 28.98 kg/m2  SpO2 94% ; weight has decreased 7 pounds since  last year, but patient reports weight has increased 12 pounds from the lowest value he reached a few months ago. General-Well developed; no acute distress Body habitus-mildly overweight Neck-No JVD; no carotid bruits Lungs-clear lung fields; resonant to percussion Cardiovascular-normal PMI; normal S1 and S2; S4 present Abdomen-normal bowel sounds; soft and non-tender without masses or organomegaly Musculoskeletal-No deformities, no cyanosis or clubbing Neurologic-Normal cranial nerves; symmetric strength and tone Skin-Warm, no significant lesions Extremities-distal pulses intact; no edema  ASSESSMENT AND PLAN:   Bing, MD 10/21/2012 4:35 PM

## 2012-10-21 NOTE — Patient Instructions (Addendum)
Your physician recommends that you schedule a follow-up appointment in: 1 YEAR   ADDENDUM:  STOP SIMVASTATIN WHEN CURRENT RX IS COMPLETE AND START ON ATORVASTATIN 40 MG DAILY THEREAFTER.  PATIENT NOTIFIED AND VERBALIZED UNDERSTANDING.

## 2012-10-21 NOTE — Assessment & Plan Note (Addendum)
Lipid profile remains good.  Patient was advised that there is an interaction between doxycycline and statins, but I cannot locate information regarding any significant problem with either simvastatin or atorvastatin. Lipid profile was somewhat better when patient was taking the latter drug. Simvastatin will be discontinued and atorvastatin resumed at 40 mg per day. Lipid profile will be rechecked in conjunction with his next office visit.

## 2012-10-25 ENCOUNTER — Other Ambulatory Visit: Payer: Self-pay | Admitting: *Deleted

## 2012-10-25 ENCOUNTER — Encounter: Payer: Self-pay | Admitting: *Deleted

## 2012-10-25 DIAGNOSIS — E782 Mixed hyperlipidemia: Secondary | ICD-10-CM

## 2013-07-18 ENCOUNTER — Encounter: Payer: Self-pay | Admitting: *Deleted

## 2013-10-10 ENCOUNTER — Other Ambulatory Visit: Payer: Self-pay | Admitting: Cardiology

## 2013-10-10 LAB — COMPREHENSIVE METABOLIC PANEL
ALBUMIN: 4 g/dL (ref 3.5–5.2)
ALT: 65 U/L — AB (ref 0–53)
AST: 42 U/L — AB (ref 0–37)
Alkaline Phosphatase: 52 U/L (ref 39–117)
BUN: 15 mg/dL (ref 6–23)
CALCIUM: 8.9 mg/dL (ref 8.4–10.5)
CHLORIDE: 103 meq/L (ref 96–112)
CO2: 28 meq/L (ref 19–32)
CREATININE: 0.85 mg/dL (ref 0.50–1.35)
Glucose, Bld: 96 mg/dL (ref 70–99)
POTASSIUM: 4.5 meq/L (ref 3.5–5.3)
SODIUM: 140 meq/L (ref 135–145)
TOTAL PROTEIN: 6.4 g/dL (ref 6.0–8.3)
Total Bilirubin: 0.6 mg/dL (ref 0.3–1.2)

## 2013-10-10 LAB — CBC
HCT: 38.2 % — ABNORMAL LOW (ref 39.0–52.0)
Hemoglobin: 13 g/dL (ref 13.0–17.0)
MCH: 31.3 pg (ref 26.0–34.0)
MCHC: 34 g/dL (ref 30.0–36.0)
MCV: 91.8 fL (ref 78.0–100.0)
PLATELETS: 180 10*3/uL (ref 150–400)
RBC: 4.16 MIL/uL — AB (ref 4.22–5.81)
RDW: 14.3 % (ref 11.5–15.5)
WBC: 4.1 10*3/uL (ref 4.0–10.5)

## 2013-10-23 ENCOUNTER — Ambulatory Visit (INDEPENDENT_AMBULATORY_CARE_PROVIDER_SITE_OTHER): Payer: PRIVATE HEALTH INSURANCE | Admitting: Cardiology

## 2013-10-23 ENCOUNTER — Encounter: Payer: Self-pay | Admitting: Cardiology

## 2013-10-23 VITALS — BP 157/88 | HR 79 | Ht 70.0 in | Wt 211.0 lb

## 2013-10-23 DIAGNOSIS — E785 Hyperlipidemia, unspecified: Secondary | ICD-10-CM

## 2013-10-23 DIAGNOSIS — I10 Essential (primary) hypertension: Secondary | ICD-10-CM

## 2013-10-23 NOTE — Patient Instructions (Signed)
Your physician recommends that you schedule a follow-up appointment in: 1 year You will receive a reminder letter two months in advance reminding you to call and schedule your appointment. If you don't receive this letter, please contact our office.

## 2013-10-23 NOTE — Progress Notes (Signed)
Clinical Summary Mr. Manlove is a 63 y.o.male former patient of Dr Lattie Haw, this is our first visit together. He is seen for the following medical problems.   1. HTN - checks bp at home every 2-3 days, typically 130s/80s. - compliant with meds - has not taken meds today  2. Hyperlipidemia - no recent panel in our system - compliant with statin  3. Tobacco - stopped smoking cigarettes 1 year ago. Still smoking 5 cigars per week  Past Medical History  Diagnosis Date  . Hyperlipidemia   . Tobacco abuse, in remission     cigars x2 a day  . Nasal fracture     S/P   . Myocarditis     Has history of MI x2, but no documentation of same; h/o coronary angiography, but results of study are not available.  . Prostatic hypertrophy, benign   . Hypertension   . Hx of adenomatous colonic polyps     per pt report many years ago     Not on File   Current Outpatient Prescriptions  Medication Sig Dispense Refill  . ALPRAZolam (XANAX) 0.5 MG tablet Take 0.5 mg by mouth at bedtime as needed. anxiety      . aspirin EC 81 MG tablet Take 162 mg by mouth 2 (two) times daily.       Marland Kitchen atorvastatin (LIPITOR) 40 MG tablet Take 1 tablet (40 mg total) by mouth daily.  90 tablet  3  . doxycycline (ORACEA) 40 MG capsule Take 40 mg by mouth every morning.      Marland Kitchen losartan-hydrochlorothiazide (HYZAAR) 100-12.5 MG per tablet Take 1 tablet by mouth daily.      . metroNIDAZOLE (METROGEL) 1 % gel Apply topically daily.      . Multiple Vitamin (MULTIVITAMIN) capsule Take 1 capsule by mouth daily.        Marland Kitchen omeprazole (PRILOSEC) 40 MG capsule Take one tablet daily      . VIAGRA 100 MG tablet Take 1 tablet by mouth Once daily as needed. For sex       No current facility-administered medications for this visit.     Past Surgical History  Procedure Laterality Date  . Colonoscopy  06/30/2006    Remote polypectomy; normal rectum/left-side transverse diverticula, pedunculated polyp in left colon,  path  with acutely and chronically inflammed granulation tissue with surface ulceration  . Appendectomy    . Rhinoplasty    . Bunionectomy    . Colonoscopy  09/01/2011    Procedure: COLONOSCOPY;  Surgeon: Daneil Dolin, MD;  Location: [facility];  Service: Endoscopy;  Laterality: N/A;  12:30     Not on File    Family History  Problem Relation Age of Onset  . Heart attack Father   . Colon cancer Neg Hx      Social History Mr. [place] reports that he has quit smoking. His smoking use included Cigars. He does not have any smokeless tobacco history on file. Mr. Ahr reports that he drinks about 8.4 ounces of alcohol per week.   Review of Systems CONSTITUTIONAL: No weight loss, fever, chills, weakness or fatigue.  HEENT: Eyes: No visual loss, blurred vision, double vision or yellow sclerae.No hearing loss, sneezing, congestion, runny nose or sore throat.  SKIN: No rash or itching.  CARDIOVASCULAR: per HPI RESPIRATORY: No shortness of breath, cough or sputum.  GASTROINTESTINAL: No anorexia, nausea, vomiting or diarrhea. No abdominal pain or blood.  GENITOURINARY: No burning on urination, no  polyuria NEUROLOGICAL: No headache, dizziness, syncope, paralysis, ataxia, numbness or tingling in the extremities. No change in bowel or bladder control.  MUSCULOSKELETAL: No muscle, back pain, joint pain or stiffness.  LYMPHATICS: No enlarged nodes. No history of splenectomy.  PSYCHIATRIC: No history of depression or anxiety.  ENDOCRINOLOGIC: No reports of sweating, cold or heat intolerance. No polyuria or polydipsia.  Marland Kitchen   Physical Examination p 79 bp 150/90 Wt 211 lbs BMI 30 Gen: resting comfortably, no acute distress HEENT: no scleral icterus, pupils equal round and reactive, no palptable cervical adenopathy,  CV: RRR, no m/r/g,no JVD,no carotid bruit Resp: Clear to auscultation bilaterally GI: abdomen is soft, non-tender, non-distended, normal bowel sounds, no  hepatosplenomegaly MSK: extremities are warm, no edema.  Skin: warm, no rash Neuro:  no focal deficits Psych: appropriate affect   Diagnostic Studies Clinic EKG Normal sinus rhythm  Assessment and Plan  1. HTN - elevated in clinic today, however he has not taken his meds yet today - home numbers are at goal - continue current meds  2. Hyperlipidemia - continue current statin - will need repeat lipid panel prior to next visit  3. Elevated LFTs - mild elevation, will forward results to PCP   Arnoldo Lenis, M.D., F.A.C.C.

## 2013-11-21 ENCOUNTER — Other Ambulatory Visit: Payer: Self-pay | Admitting: Cardiology

## 2014-12-04 ENCOUNTER — Other Ambulatory Visit: Payer: Self-pay | Admitting: Cardiology

## 2014-12-04 MED ORDER — ATORVASTATIN CALCIUM 40 MG PO TABS: ORAL_TABLET | ORAL | Status: DC

## 2014-12-04 NOTE — Telephone Encounter (Signed)
escribed to [facility]

## 2014-12-04 NOTE — Telephone Encounter (Signed)
Received fax refill request  Rx # [id number] Medication:  Atorvastatin 40 mg tablets Qty 90 Sig:  Take one tablet by mouth every day Physician:  Branch  Patient has switched pharmacies from [facility] to [facility]

## 2015-05-27 DIAGNOSIS — Z1389 Encounter for screening for other disorder: Secondary | ICD-10-CM | POA: Diagnosis not present

## 2015-05-27 DIAGNOSIS — E782 Mixed hyperlipidemia: Secondary | ICD-10-CM | POA: Diagnosis not present

## 2015-05-27 DIAGNOSIS — N400 Enlarged prostate without lower urinary tract symptoms: Secondary | ICD-10-CM | POA: Diagnosis not present

## 2015-05-27 DIAGNOSIS — Z79899 Other long term (current) drug therapy: Secondary | ICD-10-CM | POA: Diagnosis not present

## 2015-05-27 DIAGNOSIS — E784 Other hyperlipidemia: Secondary | ICD-10-CM | POA: Diagnosis not present

## 2015-05-27 DIAGNOSIS — Z23 Encounter for immunization: Secondary | ICD-10-CM | POA: Diagnosis not present

## 2015-05-27 DIAGNOSIS — I10 Essential (primary) hypertension: Secondary | ICD-10-CM | POA: Diagnosis not present

## 2015-05-27 DIAGNOSIS — M67449 Ganglion, unspecified hand: Secondary | ICD-10-CM | POA: Diagnosis not present

## 2015-07-15 DIAGNOSIS — Z1283 Encounter for screening for malignant neoplasm of skin: Secondary | ICD-10-CM | POA: Diagnosis not present

## 2015-07-15 DIAGNOSIS — L718 Other rosacea: Secondary | ICD-10-CM | POA: Diagnosis not present

## 2015-07-18 DIAGNOSIS — K605 Anorectal fistula: Secondary | ICD-10-CM | POA: Diagnosis not present

## 2015-08-27 DIAGNOSIS — K605 Anorectal fistula: Secondary | ICD-10-CM | POA: Diagnosis not present

## 2015-11-06 DIAGNOSIS — M546 Pain in thoracic spine: Secondary | ICD-10-CM | POA: Diagnosis not present

## 2015-12-16 ENCOUNTER — Ambulatory Visit (INDEPENDENT_AMBULATORY_CARE_PROVIDER_SITE_OTHER): Payer: Medicare Other | Admitting: Cardiology

## 2015-12-16 ENCOUNTER — Encounter: Payer: Self-pay | Admitting: Cardiology

## 2015-12-16 ENCOUNTER — Other Ambulatory Visit: Payer: Self-pay | Admitting: Cardiology

## 2015-12-16 VITALS — BP 132/82 | HR 76 | Ht 70.0 in | Wt 202.0 lb

## 2015-12-16 DIAGNOSIS — I10 Essential (primary) hypertension: Secondary | ICD-10-CM | POA: Diagnosis not present

## 2015-12-16 DIAGNOSIS — E785 Hyperlipidemia, unspecified: Secondary | ICD-10-CM

## 2015-12-16 MED ORDER — ATORVASTATIN CALCIUM 40 MG PO TABS: 40.0000 mg | ORAL_TABLET | Freq: Every day | ORAL | Status: AC

## 2015-12-16 NOTE — Progress Notes (Signed)
Patient ID: Shane [name], male   DOB: [birth year], 65 y.o.   MRN: [id number]     Clinical Summary [name] is a 65 y.o.male he is seen for the following medical problems.   1. HTN - checks at home once a week, typically 130s/80s - he stopped losartan-HCTZ 2 years ago on his own after losing 25 lbs   2. Hyperlipidemia -reports recent blood tests with Dr Shane [name] a few months ago - compliant with statin       Past Medical History  Diagnosis Date  . Hyperlipidemia   . Tobacco abuse, in remission     cigars x2 a day  . Nasal fracture     S/P   . Myocarditis (Shane [name])     Has history of MI x2, but no documentation of same; h/o coronary angiography, but results of study are not available.  . Prostatic hypertrophy, benign   . Hypertension   . Hx of adenomatous colonic polyps     per pt report many years ago     No Known Allergies   Current Outpatient Prescriptions  Medication Sig Dispense Refill  . aspirin EC 81 MG tablet Take 81 mg by mouth daily.     Marland Kitchen atorvastatin (LIPITOR) 40 MG tablet TAKE 1 TABLET(40 MG) BY MOUTH DAILY 30 tablet 0  . doxycycline (ORACEA) 40 MG capsule Take 40 mg by mouth every morning.    . metroNIDAZOLE (METROGEL) 1 % gel Apply topically daily.    . Multiple Vitamin (MULTIVITAMIN) capsule Take 1 capsule by mouth daily.      Marland Kitchen VIAGRA 100 MG tablet Take 1 tablet by mouth Once daily as needed. For sex     No current facility-administered medications for this visit.     Past Surgical History  Procedure Laterality Date  . Colonoscopy  06/30/2006    Remote polypectomy; normal rectum/left-side transverse diverticula, pedunculated polyp in left colon,  path with acutely and chronically inflammed granulation tissue with surface ulceration  . Appendectomy    . Rhinoplasty    . Bunionectomy    . Colonoscopy  09/01/2011    Procedure: COLONOSCOPY;  Surgeon: Daneil Dolin, MD;  Location: [facility];  Service: Endoscopy;  Laterality: N/A;  12:30       No Known Allergies    Family History  Problem Relation Age of Onset  . Heart attack Father   . Colon cancer Neg Hx      Social History [name] reports that he has quit smoking. His smoking use included Cigars. He does not have any smokeless tobacco history on file. [name] reports that he drinks about 8.4 oz of alcohol per week.   Review of Systems CONSTITUTIONAL: No weight loss, fever, chills, weakness or fatigue.  HEENT: Eyes: No visual loss, blurred vision, double vision or yellow sclerae.No hearing loss, sneezing, congestion, runny nose or sore throat.  SKIN: No rash or itching.  CARDIOVASCULAR: per HPI RESPIRATORY: No shortness of breath, cough or sputum.  GASTROINTESTINAL: No anorexia, nausea, vomiting or diarrhea. No abdominal pain or blood.  GENITOURINARY: No burning on urination, no polyuria NEUROLOGICAL: No headache, dizziness, syncope, paralysis, ataxia, numbness or tingling in the extremities. No change in bowel or bladder control.  MUSCULOSKELETAL: No muscle, back pain, joint pain or stiffness.  LYMPHATICS: No enlarged nodes. No history of splenectomy.  PSYCHIATRIC: No history of depression or anxiety.  ENDOCRINOLOGIC: No reports of sweating, cold or heat intolerance. No polyuria or polydipsia.  Marland Kitchen  Physical Examination Filed Vitals:   12/16/15 1629  BP: 132/82  Pulse: 76   Filed Weights   12/16/15 1629  Weight: 202 lb (91.627 kg)    Gen: resting comfortably, no acute distress HEENT: no scleral icterus, pupils equal round and reactive, no palptable cervical adenopathy,  CV: RRR, no m/r/g, no jvd Resp: Clear to auscultation bilaterally GI: abdomen is soft, non-tender, non-distended, normal bowel sounds, no hepatosplenomegaly MSK: extremities are warm, no edema.  Skin: warm, no rash Neuro:  no focal deficits Psych: appropriate affect   Diagnostic Studies  12/16/15 Clinic EKG (performed and reviewed in clinic): NSR   Assessment  and Plan   1. HTN - at goal, since weight loss no longer requiring meds - continue to monitor  2. Hyperlipidemia - request labs from pcp - continue current statin  3. History of tobacco abuse - order AAA screen in 65 yo male with tobacco history   F/u 1 year     Arnoldo Lenis, M.D.

## 2015-12-16 NOTE — Patient Instructions (Signed)
Your physician wants you to follow-up in: 1 YEAR WITH DR. BRANCH You will receive a reminder letter in the mail two months in advance. If you don't receive a letter, please call our office to schedule the follow-up appointment.  Your physician recommends that you continue on your current medications as directed. Please refer to the Current Medication list given to you today.  Your physician has requested that you have an abdominal aorta duplex. During this test, an ultrasound is used to evaluate the aorta. Allow 30 minutes for this exam. Do not eat after midnight the day before and avoid carbonated beverages  Thank you for choosing [facility]!!   

## 2015-12-17 ENCOUNTER — Telehealth: Payer: Self-pay | Admitting: Cardiology

## 2015-12-17 ENCOUNTER — Other Ambulatory Visit: Payer: Self-pay | Admitting: *Deleted

## 2015-12-17 DIAGNOSIS — I10 Essential (primary) hypertension: Secondary | ICD-10-CM

## 2015-12-17 DIAGNOSIS — I714 Abdominal aortic aneurysm, without rupture, unspecified: Secondary | ICD-10-CM

## 2015-12-17 DIAGNOSIS — F17290 Nicotine dependence, other tobacco product, uncomplicated: Secondary | ICD-10-CM

## 2015-12-17 NOTE — Telephone Encounter (Signed)
[place] [employer] scheduled for 12/19/15 arrive at 8:15 NPO after midnight - patient is aware

## 2015-12-17 NOTE — Telephone Encounter (Signed)
No precert required 

## 2015-12-19 ENCOUNTER — Ambulatory Visit (HOSPITAL_COMMUNITY)
Admission: RE | Admit: 2015-12-19 | Discharge: 2015-12-19 | Disposition: A | Discharge status: Home / Self Care | Payer: Medicare Other | Source: Ambulatory Visit | Attending: Cardiology | Admitting: Cardiology

## 2015-12-19 DIAGNOSIS — I10 Essential (primary) hypertension: Secondary | ICD-10-CM | POA: Diagnosis not present

## 2015-12-19 DIAGNOSIS — Z720 Tobacco use: Secondary | ICD-10-CM | POA: Insufficient documentation

## 2015-12-19 DIAGNOSIS — F17290 Nicotine dependence, other tobacco product, uncomplicated: Secondary | ICD-10-CM

## 2015-12-19 DIAGNOSIS — I714 Abdominal aortic aneurysm, without rupture, unspecified: Secondary | ICD-10-CM

## 2015-12-19 DIAGNOSIS — I77819 Aortic ectasia, unspecified site: Secondary | ICD-10-CM | POA: Insufficient documentation

## 2015-12-19 DIAGNOSIS — I77811 Abdominal aortic ectasia: Secondary | ICD-10-CM | POA: Diagnosis not present

## 2015-12-23 NOTE — Telephone Encounter (Signed)
Mailed pt letter letting him know his results.

## 2015-12-23 NOTE — Telephone Encounter (Signed)
-----   Message from Arnoldo Lenis, MD sent at 12/20/2015  1:33 PM EDT ----- Abdominal US shows that his aorta is just slightly bigger than normal, we will need to repeat US in about 5 years  Zandra Abts MD

## 2016-07-13 DIAGNOSIS — S50861A Insect bite (nonvenomous) of right forearm, initial encounter: Secondary | ICD-10-CM | POA: Diagnosis not present

## 2016-07-13 DIAGNOSIS — Z1283 Encounter for screening for malignant neoplasm of skin: Secondary | ICD-10-CM | POA: Diagnosis not present

## 2016-07-13 DIAGNOSIS — L821 Other seborrheic keratosis: Secondary | ICD-10-CM | POA: Diagnosis not present

## 2016-07-13 DIAGNOSIS — D225 Melanocytic nevi of trunk: Secondary | ICD-10-CM | POA: Diagnosis not present

## 2016-07-27 ENCOUNTER — Encounter: Payer: Self-pay | Admitting: Internal Medicine

## 2016-11-17 DIAGNOSIS — Z0001 Encounter for general adult medical examination with abnormal findings: Secondary | ICD-10-CM | POA: Diagnosis not present

## 2016-11-17 DIAGNOSIS — I10 Essential (primary) hypertension: Secondary | ICD-10-CM | POA: Diagnosis not present

## 2016-11-17 DIAGNOSIS — Z6833 Body mass index (BMI) 33.0-33.9, adult: Secondary | ICD-10-CM | POA: Diagnosis not present

## 2016-11-17 DIAGNOSIS — E782 Mixed hyperlipidemia: Secondary | ICD-10-CM | POA: Diagnosis not present

## 2016-11-17 DIAGNOSIS — K603 Anal fistula: Secondary | ICD-10-CM | POA: Diagnosis not present

## 2016-11-27 ENCOUNTER — Telehealth: Payer: Self-pay

## 2016-11-27 NOTE — Telephone Encounter (Signed)
[phone] PATIENT RECEIVED LETTER TO SCHEDULE TCS

## 2016-12-02 ENCOUNTER — Telehealth: Payer: Self-pay

## 2016-12-02 NOTE — Telephone Encounter (Signed)
Needs Phenergan 12.5 mg IV on call.

## 2016-12-02 NOTE — Telephone Encounter (Signed)
See separate triage.  

## 2016-12-02 NOTE — Telephone Encounter (Signed)
Gastroenterology Pre-Procedure Review  Request Date: 12/02/2016 Requesting Physician: On Recall/ last colonoscopy was 06/30/2006  PATIENT REVIEW QUESTIONS: The patient responded to the following health history questions as indicated:    1. Diabetes Melitis: no 2. Joint replacements in the past 12 months: no 3. Major health problems in the past 3 months: no 4. Has an artificial valve or MVP: no 5. Has a defibrillator: no 6. Has been advised in past to take antibiotics in advance of a procedure like teeth cleaning: no 7. Family history of colon cancer: no  8. Alcohol Use: drinks 2 glasses of wine daily and drinks some scotch every night 9. History of sleep apnea: no  10. History of coronary artery or other vascular stents placed within the last 12 months: no    MEDICATIONS & ALLERGIES:    Patient reports the following regarding taking any blood thinners:   Plavix? no Aspirin? YES Coumadin? no Brilinta? no Xarelto? no Eliquis? no Pradaxa? no Savaysa? no Effient? no  Patient confirms/reports the following medications:  Current Outpatient Prescriptions  Medication Sig Dispense Refill  . aspirin EC 81 MG tablet Take 81 mg by mouth daily.     Marland Kitchen atorvastatin (LIPITOR) 40 MG tablet Take 1 tablet (40 mg total) by mouth daily. 90 tablet 3  . doxycycline (ORACEA) 40 MG capsule Take 40 mg by mouth every morning.    . hyoscyamine (LEVSIN SL) 0.125 MG SL tablet Place 0.125 mg under the tongue every 4 (four) hours as needed.    Marland Kitchen losartan (COZAAR) 100 MG tablet Take 100 mg by mouth daily.    . Multiple Vitamin (MULTIVITAMIN) capsule Take 1 capsule by mouth daily.      Marland Kitchen omeprazole (PRILOSEC) 40 MG capsule Take 40 mg by mouth daily.    . metroNIDAZOLE (METROGEL) 1 % gel Apply topically daily.    Marland Kitchen VIAGRA 100 MG tablet Take 1 tablet by mouth Once daily as needed. For sex     No current facility-administered medications for this visit.     Patient confirms/reports the following allergies:   No Known Allergies  No orders of the defined types were placed in this encounter.   AUTHORIZATION INFORMATION Primary Insurance:  ID #:   Group #:  Pre-Cert / Auth required:Pre-Cert / Auth #:   Secondary Insurance:   ID #:  Group #:  Pre-Cert / Auth required:Pre-Cert / Auth #:   SCHEDULE INFORMATION: Procedure has been scheduled as follows:  Date:                 Time:   Location:   This Gastroenterology Pre-Precedure Review Form is being routed to the following provider(s): R. Garfield Cornea, MD

## 2016-12-07 ENCOUNTER — Other Ambulatory Visit: Payer: Self-pay

## 2016-12-07 DIAGNOSIS — Z1211 Encounter for screening for malignant neoplasm of colon: Secondary | ICD-10-CM

## 2016-12-07 MED ORDER — NA SULFATE-K SULFATE-MG SULF 17.5-3.13-1.6 GM/177ML PO SOLN: 1.0000 | ORAL | 0 refills | Status: DC

## 2016-12-07 NOTE — Telephone Encounter (Signed)
Orders entered and Rx went to the pharmacy and instructions mailed to pt.

## 2016-12-24 ENCOUNTER — Encounter (HOSPITAL_COMMUNITY): Payer: Self-pay | Admitting: *Deleted

## 2016-12-24 ENCOUNTER — Encounter (HOSPITAL_COMMUNITY)
Admission: RE | Disposition: A | Discharge status: Home / Self Care | Payer: Self-pay | Source: Ambulatory Visit | Attending: Internal Medicine

## 2016-12-24 ENCOUNTER — Ambulatory Visit (HOSPITAL_COMMUNITY)
Admission: RE | Admit: 2016-12-24 | Discharge: 2016-12-24 | Disposition: A | Discharge status: Home / Self Care | Payer: No Typology Code available for payment source | Source: Ambulatory Visit | Attending: Internal Medicine | Admitting: Internal Medicine

## 2016-12-24 DIAGNOSIS — D120 Benign neoplasm of cecum: Secondary | ICD-10-CM | POA: Diagnosis not present

## 2016-12-24 DIAGNOSIS — Z87891 Personal history of nicotine dependence: Secondary | ICD-10-CM | POA: Diagnosis not present

## 2016-12-24 DIAGNOSIS — Z86010 Personal history of colonic polyps: Secondary | ICD-10-CM | POA: Diagnosis not present

## 2016-12-24 DIAGNOSIS — I10 Essential (primary) hypertension: Secondary | ICD-10-CM | POA: Diagnosis not present

## 2016-12-24 DIAGNOSIS — Z7982 Long term (current) use of aspirin: Secondary | ICD-10-CM | POA: Insufficient documentation

## 2016-12-24 DIAGNOSIS — N400 Enlarged prostate without lower urinary tract symptoms: Secondary | ICD-10-CM | POA: Insufficient documentation

## 2016-12-24 DIAGNOSIS — D122 Benign neoplasm of ascending colon: Secondary | ICD-10-CM | POA: Diagnosis not present

## 2016-12-24 DIAGNOSIS — Z1212 Encounter for screening for malignant neoplasm of rectum: Secondary | ICD-10-CM | POA: Diagnosis not present

## 2016-12-24 DIAGNOSIS — K5730 Diverticulosis of large intestine without perforation or abscess without bleeding: Secondary | ICD-10-CM | POA: Diagnosis not present

## 2016-12-24 DIAGNOSIS — Z1211 Encounter for screening for malignant neoplasm of colon: Secondary | ICD-10-CM | POA: Insufficient documentation

## 2016-12-24 DIAGNOSIS — I252 Old myocardial infarction: Secondary | ICD-10-CM | POA: Diagnosis not present

## 2016-12-24 DIAGNOSIS — Z79899 Other long term (current) drug therapy: Secondary | ICD-10-CM | POA: Diagnosis not present

## 2016-12-24 DIAGNOSIS — E785 Hyperlipidemia, unspecified: Secondary | ICD-10-CM | POA: Insufficient documentation

## 2016-12-24 HISTORY — PX: COLONOSCOPY: SHX5424

## 2016-12-24 HISTORY — DX: Anal fissure, unspecified: K60.2

## 2016-12-24 SURGERY — COLONOSCOPY
Anesthesia: Moderate Sedation

## 2016-12-24 MED ORDER — ONDANSETRON HCL 4 MG/2ML IJ SOLN
INTRAMUSCULAR | Status: DC | PRN
  Administered 2016-12-24: 4 mg via INTRAVENOUS

## 2016-12-24 MED ORDER — PROMETHAZINE HCL 25 MG/ML IJ SOLN
12.5000 mg | Freq: Once | INTRAMUSCULAR | Status: AC
  Administered 2016-12-24: 12.5 mg via INTRAVENOUS

## 2016-12-24 MED ORDER — ONDANSETRON HCL 4 MG/2ML IJ SOLN
INTRAMUSCULAR | Status: AC
  Filled 2016-12-24: qty 2

## 2016-12-24 MED ORDER — MEPERIDINE HCL 100 MG/ML IJ SOLN
INTRAMUSCULAR | Status: AC
  Filled 2016-12-24: qty 2

## 2016-12-24 MED ORDER — MEPERIDINE HCL 100 MG/ML IJ SOLN
INTRAMUSCULAR | Status: DC | PRN
  Administered 2016-12-24: 50 mg via INTRAVENOUS
  Administered 2016-12-24: 25 mg via INTRAVENOUS

## 2016-12-24 MED ORDER — SODIUM CHLORIDE 0.9% FLUSH
INTRAVENOUS | Status: AC
  Filled 2016-12-24: qty 10

## 2016-12-24 MED ORDER — SODIUM CHLORIDE 0.9 % IV SOLN
INTRAVENOUS | Status: DC
  Administered 2016-12-24: 14:00:00 via INTRAVENOUS

## 2016-12-24 MED ORDER — MIDAZOLAM HCL 5 MG/5ML IJ SOLN
INTRAMUSCULAR | Status: DC | PRN
  Administered 2016-12-24: 2 mg via INTRAVENOUS
  Administered 2016-12-24 (×3): 1 mg via INTRAVENOUS

## 2016-12-24 MED ORDER — PROMETHAZINE HCL 25 MG/ML IJ SOLN
INTRAMUSCULAR | Status: AC
  Filled 2016-12-24: qty 1

## 2016-12-24 MED ORDER — MIDAZOLAM HCL 5 MG/5ML IJ SOLN
INTRAMUSCULAR | Status: AC
  Filled 2016-12-24: qty 10

## 2016-12-24 NOTE — Discharge Instructions (Addendum)

## 2016-12-24 NOTE — H&P (Signed)
_0 @   Primary Care Physician:  Glo Herring, MD Primary Gastroenterologist:  Dr. Gala Romney  Pre-Procedure History & Physical: HPI:  Shane [name] is a 66 y.o. male is here for a colonoscopy. Distant history of colonic adenoma but only diverticulosis seen on 2012 colonoscopy. No bowel symptoms. No family history of colon cancer.  Past Medical History:  Diagnosis Date  . Anal fissure   . Hx of adenomatous colonic polyps    per pt report many years ago  . Hyperlipidemia   . Hypertension   . Myocarditis ([place])    Has history of MI x2, but no documentation of same; h/o coronary angiography, but results of study are not available.  . Nasal fracture    S/P   . Prostatic hypertrophy, benign   . Tobacco abuse, in remission    cigars x2 a day    Past Surgical History:  Procedure Laterality Date  . APPENDECTOMY    . BUNIONECTOMY    . COLONOSCOPY  06/30/2006   Remote polypectomy; normal rectum/left-side transverse diverticula, pedunculated polyp in left colon,  path with acutely and chronically inflammed granulation tissue with surface ulceration  . COLONOSCOPY  09/01/2011   Procedure: COLONOSCOPY;  Surgeon: Daneil Dolin, MD;  Location: [facility];  Service: Endoscopy;  Laterality: N/A;  12:30  . RHINOPLASTY      Prior to Admission medications   Medication Sig Start Date End Date Taking? Authorizing Provider  atorvastatin (LIPITOR) 40 MG tablet Take 1 tablet (40 mg total) by mouth daily. 12/16/15  Yes Arnoldo Lenis, MD  doxycycline (ORACEA) 40 MG capsule Take 40 mg by mouth every morning.   Yes Historical Provider, MD  hyoscyamine (LEVSIN SL) 0.125 MG SL tablet Place 0.125 mg under the tongue every 4 (four) hours as needed for cramping.    Yes Historical Provider, MD  losartan (COZAAR) 100 MG tablet Take 100 mg by mouth daily.   Yes Historical Provider, MD  MINOXIDIL, TOPICAL, (ROGAINE EXTRA STRENGTH) 5 % SOLN Apply 1 application topically daily.   Yes Historical  Provider, MD  Multiple Vitamin (MULTIVITAMIN) capsule Take 1 capsule by mouth daily.     Yes Historical Provider, MD  Na Sulfate-K Sulfate-Mg Sulf (SUPREP BOWEL PREP KIT) 17.5-3.13-1.6 GM/180ML SOLN Take 1 kit by mouth as directed. 12/07/16  Yes Daneil Dolin, MD  omeprazole (PRILOSEC) 40 MG capsule Take 40 mg by mouth daily.   Yes Historical Provider, MD  VIAGRA 100 MG tablet Take 50 mg by mouth as needed for erectile dysfunction.  06/23/11  Yes Historical Provider, MD  aspirin EC 81 MG tablet Take 81 mg by mouth daily.     Historical Provider, MD  loratadine (CLARITIN) 10 MG tablet Take 10 mg by mouth daily as needed for allergies.    Historical Provider, MD    Allergies as of 12/07/2016  . (No Known Allergies)    Family History  Problem Relation Age of Onset  . Heart attack Father   . Colon cancer Neg Hx     Social History   Social History  . Marital status: Widowed    Spouse name: N/A  . Number of children: N/A  . Years of education: N/A   Occupational History  . 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 Smoking status: Former Smoker    Packs/day: 0.50    Types: Cigars  . Smokeless tobacco: Not on file  Comment: cigars currently  . Alcohol use 8.4 oz/week    7 Glasses of wine, 7 Shots of liquor per week     Comment: a glass of wine every night and glass of bourbon  . Drug use: No  . Sexual activity: Not on file   Other Topics Concern  . Not on file   Social History Narrative  . No narrative on file    Review of Systems: See HPI, otherwise negative ROS  Physical Exam: There were no vitals taken for this visit. General:   Alert,  Well-developed, well-nourished, pleasant and cooperative in NAD Lungs:  Clear throughout to auscultation.   No wheezes, crackles, or rhonchi. No acute distress. Heart:  Regular rate and rhythm; no murmurs, clicks, rubs,  or gallops. Abdomen:  Soft, nontender and nondistended. No masses,  hepatosplenomegaly or hernias noted. Normal bowel sounds, without guarding, and without rebound.     Impression:  Shane [name] is now here to undergo a surveillance colonoscopy.    Risks, benefits, limitations, imponderables and alternatives regarding colonoscopy have been reviewed with the patient. Questions have been answered. All parties agreeable.       Notice:  This dictation was prepared with Dragon dictation along with smaller phrase technology. Any transcriptional errors that result from this process are unintentional and may not be corrected upon review.

## 2016-12-24 NOTE — Op Note (Addendum)
[facility] Patient Name: Shane [name] Procedure Date: 12/24/2016 1:25 PM MRN: [id number] Date of Birth: [birth year] Attending MD: Norvel Richards , MD CSN: [id number] Age: 66 Admit Type: Outpatient Procedure:                Colonoscopy Indications:              Screening for colorectal malignant neoplasm, High                            risk colon cancer surveillance: Personal history of                            colonic polyps Providers:                Norvel Richards, MD, Hinton Rao, RN, Aram Candela Referring MD:             Redmond School, MD Medicines:                Promethazine 44.3 mg IV Complications:            No immediate complications. Estimated Blood Loss:     Estimated blood loss: none. Estimated blood loss                            was minimal. Procedure:                Pre-Anesthesia Assessment:                           - Prior to the procedure, a History and Physical                            was performed, and patient medications and                            allergies were reviewed. The patient's tolerance of                            previous anesthesia was also reviewed. The risks                            and benefits of the procedure and the sedation                            options and risks were discussed with the patient.                            All questions were answered, and informed consent                            was obtained. Prior Anticoagulants: The patient has                            taken no  previous anticoagulant or antiplatelet                            agents. ASA Grade Assessment: II - A patient with                            mild systemic disease. After reviewing the risks                            and benefits, the patient was deemed in                            satisfactory condition to undergo the procedure.                           After obtaining informed consent,  the colonoscope                            was passed under direct vision. Throughout the                            procedure, the patient's blood pressure, pulse, and                            oxygen saturations were monitored continuously. The                            EC-[id number] ([id number]) scope was introduced through                            the anus and advanced to the the cecum, identified                            by appendiceal orifice and ileocecal valve. The                            entire colon was well visualized. The ileocecal                            valve, appendiceal orifice, and rectum were                            photographed. The entire colon was well visualized.                            The quality of the bowel preparation was adequate. Scope In: 2:06:49 PM Scope Out: 2:24:22 PM Scope Withdrawal Time: 0 hours 10 minutes 41 seconds  Total Procedure Duration: 0 hours 17 minutes 33 seconds  Findings:      Multiple small and large-mouthed diverticula were found in the entire       colon.      Three semi-pedunculated polyps were found in the ascending colon and       cecum. The polyps were 4 to 6 mm in size. These polyps were removed  with       a cold snare. Resection and retrieval were complete. Estimated blood       loss was minimal.      The exam was otherwise without abnormality on direct and retroflexion       views. Impression:               - Diverticulosis in the entire examined colon.                           - Three 4 to 6 mm polyps in the ascending colon and                            in the cecum, removed with a cold snare. Resected                            and retrieved.                           - The examination was otherwise normal on direct                            and retroflexion views. Moderate Sedation:      Moderate (conscious) sedation was administered by the endoscopy nurse       and supervised by the endoscopist. The following  parameters were       monitored: oxygen saturation, heart rate, blood pressure, respiratory       rate, EKG, adequacy of pulmonary ventilation, and response to care.       Total physician intraservice time was 29 minutes. Recommendation:           - Patient has a contact number available for                            emergencies. The signs and symptoms of potential                            delayed complications were discussed with the                            patient. Return to normal activities tomorrow.                            Written discharge instructions were provided to the                            patient.                           - Resume previous diet.                           - Continue present medications.                           - Repeat colonoscopy in 10 years for screening  purposes.                           - Return to GI clinic in 1 year. Procedure Code(s):        --- Professional ---                           [phone], Colonoscopy, flexible; with removal of                            tumor(s), polyp(s), or other lesion(s) by snare                            technique                           99152, Moderate sedation services provided by the                            same physician or other qualified health care                            professional performing the diagnostic or                            therapeutic service that the sedation supports,                            requiring the presence of an independent trained                            observer to assist in the monitoring of the                            patient's level of consciousness and physiological                            status; initial 15 minutes of intraservice time,                            patient age 37 years or older                           [phone], Moderate sedation services; each additional                            15 minutes intraservice  time Diagnosis Code(s):        --- Professional ---                           Z12.11, Encounter for screening for malignant                            neoplasm of colon  Z86.010, Personal history of colonic polyps                           K57.30, Diverticulosis of large intestine without                            perforation or abscess without bleeding CPT copyright 2016 American Medical Association. All rights reserved. The codes documented in this report are preliminary and upon coder review may  be revised to meet current compliance requirements. Cristopher Estimable. Georganne Siple, MD Norvel Richards, MD 12/24/2016 2:34:53 PM This report has been signed electronically. Number of Addenda: 1 Addendum Number: 1   Addendum Date: 12/28/2016 3:49:34 PM      polyps removed were adenomas; recommend repeat colonoopy for survillance       purposes in 3 years; not screening in 10 years. Cristopher Estimable. Landin Tallon, MD Norvel Richards, MD 12/28/2016 3:50:57 PM This report has been signed electronically.

## 2016-12-28 ENCOUNTER — Encounter: Payer: Self-pay | Admitting: Internal Medicine

## 2016-12-29 ENCOUNTER — Encounter (HOSPITAL_COMMUNITY): Payer: Self-pay | Admitting: Internal Medicine

## 2018-01-12 ENCOUNTER — Encounter: Payer: Self-pay | Admitting: Internal Medicine

## 2018-05-13 ENCOUNTER — Ambulatory Visit: Payer: No Typology Code available for payment source | Admitting: Gastroenterology

## 2018-05-13 ENCOUNTER — Encounter: Payer: Self-pay | Admitting: Gastroenterology

## 2018-05-13 VITALS — BP 141/84 | HR 85 | Temp 98.2°F | Ht 70.0 in | Wt 203.8 lb

## 2018-05-13 DIAGNOSIS — Z86010 Personal history of colonic polyps: Secondary | ICD-10-CM | POA: Diagnosis not present

## 2018-05-13 NOTE — Progress Notes (Signed)
Primary Care Physician: Redmond School, MD  Primary Gastroenterologist:  Garfield Cornea, MD   Chief Complaint  Patient presents with  . Colonoscopy    1 yr f/u    HPI: Shane [name] is a 67 y.o. male here for one-year follow-up.  Patient had a high risk colon cancer surveillance colonoscopy in April 2018.  He had 3 semi-pedunculated polyps removed from the ascending colon and cecum, 4 to 6 mm in size, pathology revealed tubular adenomas.  Also with diverticulosis.  His prep was adequate.  He has been advised to come back 3-year surveillance colonoscopy in April 2021.  Clinically he feels well.  No bowel concerns.  No blood in the stool or melena.  Appetite is good.  No reflux symptoms.  No family history of colon cancer.  BM regular. [place] 3-5.   Brbpr, spot. H/o hemorrhoid cautery. Was a lot worse, 3 years ago.   Ibs, rare symptosm. levsin only prn   Current Outpatient Medications  Medication Sig Dispense Refill  . amLODipine (NORVASC) 5 MG tablet Take 1 tablet by mouth daily.  3  . aspirin EC 81 MG tablet Take 81 mg by mouth daily.     Marland Kitchen atorvastatin (LIPITOR) 40 MG tablet Take 1 tablet (40 mg total) by mouth daily. 90 tablet 3  . doxycycline (ORACEA) 40 MG capsule Take 40 mg by mouth every morning.    . hyoscyamine (LEVSIN SL) 0.125 MG SL tablet Place 0.125 mg under the tongue every 4 (four) hours as needed for cramping.     . loratadine (CLARITIN) 10 MG tablet Take 10 mg by mouth daily as needed for allergies.    Marland Kitchen losartan (COZAAR) 100 MG tablet Take 100 mg by mouth daily.    Marland Kitchen MINOXIDIL, TOPICAL, (ROGAINE EXTRA STRENGTH) 5 % SOLN Apply 1 application topically daily.    . Multiple Vitamin (MULTIVITAMIN) capsule Take 1 capsule by mouth daily.      Marland Kitchen omeprazole (PRILOSEC) 40 MG capsule Take 40 mg by mouth daily.    Marland Kitchen VIAGRA 100 MG tablet Take 50 mg by mouth as needed for erectile dysfunction.      No current facility-administered medications for this visit.       Allergies as of 05/13/2018  . (No Known Allergies)    ROS:  General: Negative for anorexia, weight loss, fever, chills, fatigue, weakness. ENT: Negative for hoarseness, difficulty swallowing , nasal congestion. CV: Negative for chest pain, angina, palpitations, dyspnea on exertion, peripheral edema.  Respiratory: Negative for dyspnea at rest, dyspnea on exertion, cough, sputum, wheezing.  GI: See history of present illness. GU:  Negative for dysuria, hematuria, urinary incontinence, urinary frequency, nocturnal urination.  Endo: Negative for unusual weight change.    Physical Examination:   BP (!) 141/84   Pulse 85   Temp 98.2 F (36.8 C) (Oral)   Ht 5\' 10"  (1.778 m)   Wt 203 lb 12.8 oz (92.4 kg)   BMI 29.24 kg/m   General: Well-nourished, well-developed in no acute distress.  Eyes: No icterus. Mouth: Oropharyngeal mucosa moist and pink , no lesions erythema or exudate. Lungs: Clear to auscultation bilaterally.  Heart: Regular rate and rhythm, no murmurs rubs or gallops.  Abdomen: Bowel sounds are normal, nontender, nondistended, no hepatosplenomegaly or masses, no abdominal bruits or hernia , no rebound or guarding.   Extremities: No lower extremity edema. No clubbing or deformities. Neuro: Alert and oriented x 4   Skin: Warm and dry, no  jaundice.   Psych: Alert and cooperative, normal mood and affect.

## 2018-05-13 NOTE — Patient Instructions (Addendum)
1. Plan for next colonoscopy in 12/2019 UNLESS you haven change on stools/bowel habits, increased rectal bleeding or any other issues arise. If you have any of these changes, contact us sooner.

## 2018-05-13 NOTE — Assessment & Plan Note (Signed)
Medically doing well.  Up-to-date on colonoscopy.  Next surveillance colonoscopy planned for April 2021.  Patient will call if he has any change in his bowel habits, rectal bleeding, anemia, weight loss.

## 2018-05-17 NOTE — Progress Notes (Signed)
CC'D TO PCP °

## 2019-10-26 ENCOUNTER — Ambulatory Visit: Payer: No Typology Code available for payment source | Attending: Internal Medicine

## 2019-10-26 DIAGNOSIS — Z23 Encounter for immunization: Secondary | ICD-10-CM

## 2019-10-26 NOTE — Progress Notes (Signed)
   Covid-19 Vaccination Clinic  Name:  DHEERAN BORTH    MRN: [id number] DOB: 10-Jan-1951  10/26/2019  Mr. Colquitt was observed post Covid-19 immunization for 15 minutes without incidence. He was provided with Vaccine Information Sheet and instruction to access the V-Safe system.   Mr. Swett was instructed to call 911 with any severe reactions post vaccine: Marland Kitchen Difficulty breathing  . Swelling of your face and throat  . A fast heartbeat  . A bad rash all over your body  . Dizziness and weakness    Immunizations Administered    Name Date Dose VIS Date Route   Pfizer COVID-19 Vaccine 10/26/2019  3:37 PM 0.3 mL 08/25/2019 Intramuscular   Manufacturer: [employer], [employer]   Lot: [id number]   [place]: [id number]

## 2019-10-28 ENCOUNTER — Ambulatory Visit: Payer: No Typology Code available for payment source | Attending: Internal Medicine

## 2019-11-18 ENCOUNTER — Ambulatory Visit: Payer: No Typology Code available for payment source | Attending: Internal Medicine

## 2019-11-18 DIAGNOSIS — Z23 Encounter for immunization: Secondary | ICD-10-CM | POA: Insufficient documentation

## 2019-11-18 NOTE — Progress Notes (Signed)
   Covid-19 Vaccination Clinic  Name:  TREYTON VELETA    MRN: [id number] DOB: April 15, 1951  11/18/2019  Mr. Hamden was observed post Covid-19 immunization for 15 minutes without incident. He was provided with Vaccine Information Sheet and instruction to access the V-Safe system.   Mr. Johnson was instructed to call 911 with any severe reactions post vaccine: Marland Kitchen Difficulty breathing  . Swelling of face and throat  . A fast heartbeat  . A bad rash all over body  . Dizziness and weakness   Immunizations Administered    Name Date Dose VIS Date Route   Pfizer COVID-19 Vaccine 11/18/2019  1:16 PM 0.3 mL 08/25/2019 Intramuscular   Manufacturer: [place]   Lot: [id number]   [place]: [id number]

## 2019-11-27 ENCOUNTER — Encounter: Payer: Self-pay | Admitting: Internal Medicine

## 2020-05-03 ENCOUNTER — Encounter: Payer: Self-pay | Admitting: Gastroenterology

## 2020-05-03 ENCOUNTER — Other Ambulatory Visit: Payer: Self-pay

## 2020-05-03 ENCOUNTER — Ambulatory Visit (INDEPENDENT_AMBULATORY_CARE_PROVIDER_SITE_OTHER): Payer: No Typology Code available for payment source | Admitting: Gastroenterology

## 2020-05-03 VITALS — BP 162/92 | HR 79 | Temp 97.2°F | Ht 70.0 in | Wt 211.6 lb

## 2020-05-03 DIAGNOSIS — K219 Gastro-esophageal reflux disease without esophagitis: Secondary | ICD-10-CM | POA: Insufficient documentation

## 2020-05-03 DIAGNOSIS — Z86010 Personal history of colonic polyps: Secondary | ICD-10-CM | POA: Diagnosis not present

## 2020-05-03 NOTE — Progress Notes (Signed)
Referring Provider: Redmond School, MD Primary Care Physician:  Redmond School, MD Primary GI: Dr. Gala Romney   Chief Complaint  Patient presents with  . Colonoscopy    HPI:   Shane [name] is a 69 y.o. male presenting today with a history of adenomas, last colonoscopy in April 2018 with 3 semi-pedunculated polyps removed from the ascending colon and cecum, 4 to 6 mm in size, pathology revealed tubular adenomas. 3-year-surveillance due now.   Levsin prn. History of IBS, long-standing, occasional abdominal cramping. No rectal bleeding. Prilosec daily. No dysphagia. Levsin has worked well just sparingly. No changes in his baseline. No unexplained weight loss or lack of appetite.   Past Medical History:  Diagnosis Date  . Anal fissure   . Hx of adenomatous colonic polyps    per pt report many years ago  . Hyperlipidemia   . Hypertension   . 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  . Nasal fracture    S/P   . Prostatic hypertrophy, benign   . Tobacco abuse, in remission    cigars x2 a day    Past Surgical History:  Procedure Laterality Date  . APPENDECTOMY    . BUNIONECTOMY    . COLONOSCOPY  06/30/2006   Remote polypectomy; normal rectum/left-side transverse diverticula, pedunculated polyp in left colon,  path with acutely and chronically inflammed granulation tissue with surface ulceration  . COLONOSCOPY  09/01/2011   Procedure: COLONOSCOPY;  Surgeon: Daneil Dolin, MD;  Location: [facility];  Service: Endoscopy;  Laterality: N/A;  12:30  . COLONOSCOPY N/A 12/24/2016   Dr. Gala Romney: 3 semi-pedunculated polyps measuring 4 to 6 mm in size removed from the ascending colon and cecum.  Diverticulosis.  Pathology revealed tubular adenomas.  Next colonoscopy planned for April 2021.  Marland Kitchen RHINOPLASTY      Current Outpatient Medications  Medication Sig Dispense Refill  .  amLODipine (NORVASC) 5 MG tablet Take 1 tablet by mouth daily.  3  . aspirin EC 81 MG tablet Take 81 mg by mouth daily.     Marland Kitchen atorvastatin (LIPITOR) 40 MG tablet Take 1 tablet (40 mg total) by mouth daily. 90 tablet 3  . Cyanocobalamin (B-12) 2000 MCG TABS Take by mouth daily.    Marland Kitchen doxycycline (ORACEA) 40 MG capsule Take 50 mg by mouth every morning.     . hyoscyamine (LEVSIN SL) 0.125 MG SL tablet Place 0.125 mg under the tongue as needed for cramping.     . loratadine (CLARITIN) 10 MG tablet Take 10 mg by mouth daily as needed for allergies.    Marland Kitchen losartan (COZAAR) 100 MG tablet Take 100 mg by mouth daily.    Marland Kitchen MINOXIDIL, TOPICAL, (ROGAINE EXTRA STRENGTH) 5 % SOLN Apply 1 application topically daily.    . Multiple Vitamin (MULTIVITAMIN) capsule Take 1 capsule by mouth daily.      Marland Kitchen omeprazole (PRILOSEC) 40 MG capsule Take 40 mg by mouth daily.    Marland Kitchen VIAGRA 100 MG tablet Take 50 mg by mouth as needed for erectile dysfunction.      No current facility-administered medications for this visit.    Allergies as of 05/03/2020  . (No Known Allergies)    Family History  Problem Relation Age of Onset  . Heart attack Father   . Colon cancer Neg Hx     Social History   Socioeconomic History  .  Marital status: Widowed    Spouse name: Not on file  . Number of children: Not on file  . Years of education: Not on file  . Highest education level: Not on file  Occupational History  . Occupation: retired    Fish farm manager: [employer]    Comment: post office  Tobacco Use  . Smoking status: Current Every Day Smoker    Packs/day: 0.25    Types: Cigars  . Smokeless tobacco: Never Used  . Tobacco comment: cigars currently  Substance and Sexual Activity  . Alcohol use: Yes    Alcohol/week: 14.0 standard drinks    Types: 7 Glasses of wine, 7 Shots of liquor per week    Comment: 2 glasses of wine every night and glass of bourbon  . Drug use: No  . Sexual activity: Not on file  Other Topics  Concern  . Not on file  Social History Narrative  . Not on file   Social Determinants of Health   Financial Resource Strain:   . Difficulty of Paying Living Expenses: Not on file  Food Insecurity:   . Worried About Charity fundraiser in the Last Year: Not on file  . Ran Out of Food in the Last Year: Not on file  Transportation Needs:   . Lack of Transportation (Medical): Not on file  . Lack of Transportation (Non-Medical): Not on file  Physical Activity:   . Days of Exercise per Week: Not on file  . Minutes of Exercise per Session: Not on file  Stress:   . Feeling of Stress : Not on file  Social Connections:   . Frequency of Communication with Friends and Family: Not on file  . Frequency of Social Gatherings with Friends and Family: Not on file  . Attends Religious Services: Not on file  . Active Member of Clubs or Organizations: Not on file  . Attends Archivist Meetings: Not on file  . Marital Status: Not on file    Review of Systems: Gen: Denies fever, chills, anorexia. Denies fatigue, weakness, weight loss.  CV: Denies chest pain, palpitations, syncope, peripheral edema, and claudication. Resp: Denies dyspnea at rest, cough, wheezing, coughing up blood, and pleurisy. GI: see HPI Derm: Denies rash, itching, dry skin Psych: Denies depression, anxiety, memory loss, confusion. No homicidal or suicidal ideation.  Heme: Denies bruising, bleeding, and enlarged lymph nodes.  Physical Exam: BP (!) 162/92   Pulse 79   Temp (!) 97.2 F (36.2 C) (Oral)   Ht 5\' 10"  (1.778 m)   Wt 211 lb 9.6 oz (96 kg)   BMI 30.36 kg/m  General:   Alert and oriented. No distress noted. Pleasant and cooperative.  Head:  Normocephalic and atraumatic. Eyes:  Conjuctiva clear without scleral icterus. Mouth:  Mask in place Cardiac: S1 S2 present, ?soft systolic murmur Lungs: Clear bilaterally Abdomen:  +BS, soft, non-tender and non-distended. No rebound or guarding. No HSM or masses  noted. Msk:  Symmetrical without gross deformities. Normal posture. Extremities:  Without edema. Neurologic:  Alert and  oriented x4 Psych:  Alert and cooperative. Normal mood and affect.  ASSESSMENT: Shane [name] is a 69 y.o. male presenting today with history of adenomas, due for 3-year-surveillance now.   Doing well without any concerning lower GI signs/symptoms. Chronic GERD well-managed with Prilosec daily. He has several risk factors for Barrett's, no prior EGD, but declining screening at this time. He has no alarm signs/symptoms. Discussed alarm signs/symptoms to report. Continue Prilosec  daily.    PLAN:   Proceed with TCS with Dr. Gala Romney in near future using Propofol: the risks, benefits, and alternatives have been discussed with the patient in detail. The patient states understanding and desires to proceed. ASA III  Prilosec daily  Further recommendations to follow  Annitta Needs, PhD, ANP-BC [facility]

## 2020-05-03 NOTE — Patient Instructions (Signed)
We are arranging a colonoscopy in the near future.  Please call if any problems swallowing, weight loss, abdominal pain, blood in stool.  Further recommendations to follow!  It was a pleasure to see you today. I want to create trusting relationships with patients to provide genuine, compassionate, and quality care. I value your feedback. If you receive a survey regarding your visit,  I greatly appreciate you taking time to fill this out.   Annitta Needs, PhD, ANP-BC [facility]

## 2020-05-07 ENCOUNTER — Other Ambulatory Visit: Payer: Self-pay

## 2020-07-03 NOTE — Patient Instructions (Signed)
Your procedure is scheduled on: 07/08/2020  Report to [facility] at   10:45  AM.  Call this number if you have problems the morning of surgery: [phone]   Remember:              Follow Directions on the letter you received from Your Physician's office regarding the Bowel Prep              No Smoking the day of Procedure :   Take these medicines the morning of surgery with A SIP OF WATER: Amlodipine and omeprazole   Do not wear jewelry, make-up or nail polish.    Do not bring valuables to the hospital.  Contacts, dentures or bridgework may not be worn into surgery.  .   Patients discharged the day of surgery will not be allowed to drive home.     Colonoscopy, Adult, Care After This sheet gives you information about how to care for yourself after your procedure. Your health care provider may also give you more specific instructions. If you have problems or questions, contact your health care provider. What can I expect after the procedure? After the procedure, it is common to have:  A small amount of blood in your stool for 24 hours after the procedure.  Some gas.  Mild abdominal cramping or bloating.  Follow these instructions at home: General instructions   For the first 24 hours after the procedure: ? Do not drive or use machinery. ? Do not sign important documents. ? Do not drink alcohol. ? Do your regular daily activities at a slower pace than normal. ? Eat soft, easy-to-digest foods. ? Rest often.  Take over-the-counter or prescription medicines only as told by your health care provider.  It is up to you to get the results of your procedure. Ask your health care provider, or the department performing the procedure, when your results will be ready. Relieving cramping and bloating  Try walking around when you have cramps or feel bloated.  Apply heat to your abdomen as told by your health care provider. Use a heat source that your health care provider  recommends, such as a moist heat pack or a heating pad. ? Place a towel between your skin and the heat source. ? Leave the heat on for 20-30 minutes. ? Remove the heat if your skin turns bright red. This is especially important if you are unable to feel pain, heat, or cold. You may have a greater risk of getting burned. Eating and drinking  Drink enough fluid to keep your urine clear or pale yellow.  Resume your normal diet as instructed by your health care provider. Avoid heavy or fried foods that are hard to digest.  Avoid drinking alcohol for as long as instructed by your health care provider. Contact a health care provider if:  You have blood in your stool 2-3 days after the procedure. Get help right away if:  You have more than a small spotting of blood in your stool.  You pass large blood clots in your stool.  Your abdomen is swollen.  You have nausea or vomiting.  You have a fever.  You have increasing abdominal pain that is not relieved with medicine. This information is not intended to replace advice given to you by your health care provider. Make sure you discuss any questions you have with your health care provider. Document Released: 04/14/2004 Document Revised: 05/25/2016 Document Reviewed: 11/12/2015 Elsevier Interactive Patient Education  2018 Elsevier  Inc. 

## 2020-07-05 ENCOUNTER — Other Ambulatory Visit: Payer: Self-pay

## 2020-07-05 ENCOUNTER — Other Ambulatory Visit (HOSPITAL_COMMUNITY)
Admission: RE | Admit: 2020-07-05 | Discharge: 2020-07-05 | Disposition: A | Discharge status: Home / Self Care | Payer: No Typology Code available for payment source | Source: Ambulatory Visit | Attending: Internal Medicine | Admitting: Internal Medicine

## 2020-07-05 ENCOUNTER — Encounter (HOSPITAL_COMMUNITY)
Admission: RE | Admit: 2020-07-05 | Discharge: 2020-07-05 | Disposition: A | Discharge status: Home / Self Care | Payer: No Typology Code available for payment source | Source: Ambulatory Visit | Attending: Internal Medicine | Admitting: Internal Medicine

## 2020-07-05 ENCOUNTER — Encounter (HOSPITAL_COMMUNITY): Payer: Self-pay

## 2020-07-05 DIAGNOSIS — Z20822 Contact with and (suspected) exposure to covid-19: Secondary | ICD-10-CM | POA: Insufficient documentation

## 2020-07-05 DIAGNOSIS — Z01818 Encounter for other preprocedural examination: Secondary | ICD-10-CM | POA: Insufficient documentation

## 2020-07-05 DIAGNOSIS — I10 Essential (primary) hypertension: Secondary | ICD-10-CM | POA: Diagnosis not present

## 2020-07-05 DIAGNOSIS — Z01812 Encounter for preprocedural laboratory examination: Secondary | ICD-10-CM | POA: Insufficient documentation

## 2020-07-05 LAB — SARS CORONAVIRUS 2 (TAT 6-24 HRS): SARS Coronavirus 2: NEGATIVE

## 2020-07-08 ENCOUNTER — Ambulatory Visit (HOSPITAL_COMMUNITY): Payer: No Typology Code available for payment source | Admitting: Anesthesiology

## 2020-07-08 ENCOUNTER — Encounter (HOSPITAL_COMMUNITY)
Admission: RE | Disposition: A | Discharge status: Home / Self Care | Payer: Self-pay | Source: Home / Self Care | Attending: Internal Medicine

## 2020-07-08 ENCOUNTER — Encounter (HOSPITAL_COMMUNITY): Payer: Self-pay | Admitting: Internal Medicine

## 2020-07-08 ENCOUNTER — Ambulatory Visit (HOSPITAL_COMMUNITY)
Admission: RE | Admit: 2020-07-08 | Discharge: 2020-07-08 | Disposition: A | Discharge status: Home / Self Care | Payer: No Typology Code available for payment source | Attending: Internal Medicine | Admitting: Internal Medicine

## 2020-07-08 DIAGNOSIS — Z79899 Other long term (current) drug therapy: Secondary | ICD-10-CM | POA: Diagnosis not present

## 2020-07-08 DIAGNOSIS — K5730 Diverticulosis of large intestine without perforation or abscess without bleeding: Secondary | ICD-10-CM | POA: Diagnosis not present

## 2020-07-08 DIAGNOSIS — Z1211 Encounter for screening for malignant neoplasm of colon: Secondary | ICD-10-CM | POA: Insufficient documentation

## 2020-07-08 DIAGNOSIS — F17290 Nicotine dependence, other tobacco product, uncomplicated: Secondary | ICD-10-CM | POA: Insufficient documentation

## 2020-07-08 DIAGNOSIS — I10 Essential (primary) hypertension: Secondary | ICD-10-CM | POA: Diagnosis not present

## 2020-07-08 DIAGNOSIS — Z86010 Personal history of colonic polyps: Secondary | ICD-10-CM | POA: Diagnosis not present

## 2020-07-08 HISTORY — PX: COLONOSCOPY WITH PROPOFOL: SHX5780

## 2020-07-08 SURGERY — COLONOSCOPY WITH PROPOFOL
Anesthesia: General

## 2020-07-08 MED ORDER — PROPOFOL 10 MG/ML IV BOLUS
INTRAVENOUS | Status: DC | PRN
  Administered 2020-07-08: 40 mg via INTRAVENOUS
  Administered 2020-07-08: 30 mg via INTRAVENOUS
  Administered 2020-07-08 (×2): 50 mg via INTRAVENOUS
  Administered 2020-07-08 (×3): 20 mg via INTRAVENOUS

## 2020-07-08 MED ORDER — PROPOFOL 10 MG/ML IV BOLUS
INTRAVENOUS | Status: AC
  Filled 2020-07-08: qty 40

## 2020-07-08 MED ORDER — LACTATED RINGERS IV SOLN: INTRAVENOUS | Status: DC | PRN

## 2020-07-08 MED ORDER — CHLORHEXIDINE GLUCONATE CLOTH 2 % EX PADS: 6.0000 | MEDICATED_PAD | Freq: Once | CUTANEOUS | Status: DC

## 2020-07-08 MED ORDER — PROPOFOL 500 MG/50ML IV EMUL
INTRAVENOUS | Status: DC | PRN
  Administered 2020-07-08: 150 ug/kg/min via INTRAVENOUS

## 2020-07-08 MED ORDER — LACTATED RINGERS IV SOLN: Freq: Once | INTRAVENOUS | Status: AC

## 2020-07-08 MED ORDER — STERILE WATER FOR IRRIGATION IR SOLN
Status: DC | PRN
  Administered 2020-07-08: 1.5 mL

## 2020-07-08 NOTE — Discharge Instructions (Signed)
Colonoscopy Discharge Instructions  Read the instructions outlined below and refer to this sheet in the next few weeks. These discharge instructions provide you with general information on caring for yourself after you leave the hospital. Your doctor may also give you specific instructions. While your treatment has been planned according to the most current medical practices available, unavoidable complications occasionally occur. If you have any problems or questions after discharge, call Dr. Gala Romney at [phone]. ACTIVITY  You may resume your regular activity, but move at a slower pace for the next 24 hours.   Take frequent rest periods for the next 24 hours.   Walking will help get rid of the air and reduce the bloated feeling in your belly (abdomen).   No driving for 24 hours (because of the medicine (anesthesia) used during the test).    Do not sign any important legal documents or operate any machinery for 24 hours (because of the anesthesia used during the test).  NUTRITION  Drink plenty of fluids.   You may resume your normal diet as instructed by your doctor.   Begin with a light meal and progress to your normal diet. Heavy or fried foods are harder to digest and may make you feel sick to your stomach (nauseated).   Avoid alcoholic beverages for 24 hours or as instructed.  MEDICATIONS  You may resume your normal medications unless your doctor tells you otherwise.  WHAT YOU CAN EXPECT TODAY  Some feelings of bloating in the abdomen.   Passage of more gas than usual.   Spotting of blood in your stool or on the toilet paper.  IF YOU HAD POLYPS REMOVED DURING THE COLONOSCOPY:  No aspirin products for 7 days or as instructed.   No alcohol for 7 days or as instructed.   Eat a soft diet for the next 24 hours.  FINDING OUT THE RESULTS OF YOUR TEST Not all test results are available during your visit. If your test results are not back during the visit, make an appointment  with your caregiver to find out the results. Do not assume everything is normal if you have not heard from your caregiver or the medical facility. It is important for you to follow up on all of your test results.  SEEK IMMEDIATE MEDICAL ATTENTION IF:  You have more than a spotting of blood in your stool.   Your belly is swollen (abdominal distention).   You are nauseated or vomiting.   You have a temperature over 101.   You have abdominal pain or discomfort that is severe or gets worse throughout the day.    Diverticulosis  Diverticulosis is a condition that develops when small pouches (diverticula) form in the wall of the large intestine (colon). The colon is where water is absorbed and stool (feces) is formed. The pouches form when the inside layer of the colon pushes through weak spots in the outer layers of the colon. You may have a few pouches or many of them. The pouches usually do not cause problems unless they become inflamed or infected. When this happens, the condition is called diverticulitis. What are the causes? The cause of this condition is not known. What increases the risk? The following factors may make you more likely to develop this condition:  Being older than age 36. Your risk for this condition increases with age. Diverticulosis is rare among people younger than age 78. By age 39, many people have it.  Eating a low-fiber diet.  Having  frequent constipation.  Being overweight.  Not getting enough exercise.  Smoking.  Taking over-the-counter pain medicines, like aspirin and ibuprofen.  Having a family history of diverticulosis. What are the signs or symptoms? In most people, there are no symptoms of this condition. If you do have symptoms, they may include:  Bloating.  Cramps in the abdomen.  Constipation or diarrhea.  Pain in the lower left side of the abdomen. How is this diagnosed? Because diverticulosis usually has no symptoms, it is most  often diagnosed during an exam for other colon problems. The condition may be diagnosed by:  Using a flexible scope to examine the colon (colonoscopy).  Taking an X-ray of the colon after dye has been put into the colon (barium enema).  Having a CT scan. How is this treated? You may not need treatment for this condition. Your health care provider may recommend treatment to prevent problems. You may need treatment if you have symptoms or if you previously had diverticulitis. Treatment may include:  Eating a high-fiber diet.  Taking a fiber supplement.  Taking a live bacteria supplement (probiotic).  Taking medicine to relax your colon. Follow these instructions at home: Medicines  Take over-the-counter and prescription medicines only as told by your health care provider.  If told by your health care provider, take a fiber supplement or probiotic. Constipation prevention Your condition may cause constipation. To prevent or treat constipation, you may need to:  Drink enough fluid to keep your urine pale yellow.  Take over-the-counter or prescription medicines.  Eat foods that are high in fiber, such as beans, whole grains, and fresh fruits and vegetables.  Limit foods that are high in fat and processed sugars, such as fried or sweet foods.  General instructions  Try not to strain when you have a bowel movement.  Keep all follow-up visits as told by your health care provider. This is important. Contact a health care provider if you:  Have pain in your abdomen.  Have bloating.  Have cramps.  Have not had a bowel movement in 3 days. Get help right away if:  Your pain gets worse.  Your bloating becomes very bad.  You have a fever or chills, and your symptoms suddenly get worse.  You vomit.  You have bowel movements that are bloody or black.  You have bleeding from your rectum. Summary  Diverticulosis is a condition that develops when small pouches (diverticula)  form in the wall of the large intestine (colon).  You may have a few pouches or many of them.  This condition is most often diagnosed during an exam for other colon problems.  Treatment may include increasing the fiber in your diet, taking supplements, or taking medicines. This information is not intended to replace advice given to you by your health care provider. Make sure you discuss any questions you have with your health care provider. Document Revised: 03/30/2019 Document Reviewed: 03/30/2019 Elsevier Patient Education  [employer].   Diverticulosis only found today.  No polyps.  Information on diverticulosis provided  Recommend a repeat colonoscopy in 5 years

## 2020-07-08 NOTE — Anesthesia Preprocedure Evaluation (Signed)
Anesthesia Evaluation  Patient identified by MRN, date of birth, ID band Patient awake    Reviewed: Allergy & Precautions, NPO status , Patient's Chart, lab work & pertinent test results, reviewed documented beta blocker date and time   Airway Mallampati: III  TM Distance: >3 FB Neck ROM: Full    Dental  (+) Dental Advisory Given, Teeth Intact   Pulmonary sleep apnea (snoring) , Current Smoker and Patient abstained from smoking.,    Pulmonary exam normal breath sounds clear to auscultation       Cardiovascular Exercise Tolerance: Good hypertension, Pt. on medications Normal cardiovascular exam Rhythm:Regular Rate:Normal     Neuro/Psych negative neurological ROS  negative psych ROS   GI/Hepatic Neg liver ROS, GERD  Medicated,  Endo/Other  negative endocrine ROS  Renal/GU negative Renal ROS  negative genitourinary   Musculoskeletal negative musculoskeletal ROS (+)   Abdominal   Peds negative pediatric ROS (+)  Hematology negative hematology ROS (+)   Anesthesia Other Findings   Reproductive/Obstetrics negative OB ROS                             Anesthesia Physical Anesthesia Plan  ASA: II  Anesthesia Plan: General   Post-op Pain Management:    Induction: Intravenous  PONV Risk Score and Plan: TIVA  Airway Management Planned: Nasal Cannula and Natural Airway  Additional Equipment:   Intra-op Plan:   Post-operative Plan:   Informed Consent: I have reviewed the patients History and Physical, chart, labs and discussed the procedure including the risks, benefits and alternatives for the proposed anesthesia with the patient or authorized representative who has indicated his/her understanding and acceptance.     Dental advisory given  Plan Discussed with: CRNA and Surgeon  Anesthesia Plan Comments:         Anesthesia Quick Evaluation

## 2020-07-08 NOTE — H&P (Signed)
@LOGO @   Primary Care Physician:  Redmond School, MD Primary Gastroenterologist:  Dr. Gala Romney Pre-Procedure History & Physical: HPI:  Shane [name] is a 70 y.o. male here for surveillance colonoscopy.  History of colonic adenomas-multiple removed in 2018.  Past Medical History:  Diagnosis Date  . Anal fissure   . Hx of adenomatous colonic polyps    per pt report many years ago  . Hyperlipidemia   . Hypertension   . 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  . Nasal fracture    S/P   . Prostatic hypertrophy, benign   . Tobacco abuse, in remission    cigars x2 a day    Past Surgical History:  Procedure Laterality Date  . APPENDECTOMY    . BUNIONECTOMY    . COLONOSCOPY  06/30/2006   Remote polypectomy; normal rectum/left-side transverse diverticula, pedunculated polyp in left colon,  path with acutely and chronically inflammed granulation tissue with surface ulceration  . COLONOSCOPY  09/01/2011   Procedure: COLONOSCOPY;  Surgeon: Daneil Dolin, MD;  Location: [facility];  Service: Endoscopy;  Laterality: N/A;  12:30  . COLONOSCOPY N/A 12/24/2016   Dr. Gala Romney: 3 semi-pedunculated polyps measuring 4 to 6 mm in size removed from the ascending colon and cecum.  Diverticulosis.  Pathology revealed tubular adenomas.  Next colonoscopy planned for April 2021.  Marland Kitchen RHINOPLASTY      Prior to Admission medications   Medication Sig Start Date End Date Taking? Authorizing Provider  amLODipine (NORVASC) 5 MG tablet Take 5 mg by mouth daily.  04/07/18  Yes [provider]  aspirin EC 81 MG tablet Take 81 mg by mouth daily.    Yes [provider]  atorvastatin (LIPITOR) 40 MG tablet Take 1 tablet (40 mg total) by mouth daily. 12/16/15  Yes BranchAlphonse Guild, MD  cyanocobalamin 2000 MCG tablet Take 2,000 mcg by mouth daily.   Yes [provider]  doxycycline  (VIBRAMYCIN) 50 MG capsule Take 50 mg by mouth daily. 02/15/20  Yes [provider]  hyoscyamine (LEVSIN) 0.125 MG tablet Take 0.125 mg by mouth daily as needed (GI pain).   Yes [provider]  ketotifen (ALAWAY) 0.025 % ophthalmic solution Place 1 drop into both eyes daily as needed (allergies).   Yes [provider]  loratadine (CLARITIN) 10 MG tablet Take 10 mg by mouth daily as needed for allergies.   Yes [provider]  losartan (COZAAR) 100 MG tablet Take 100 mg by mouth daily.   Yes [provider]  MINOXIDIL, TOPICAL, (ROGAINE EXTRA STRENGTH) 5 % SOLN Apply 1 application topically daily.   Yes [provider]  Multiple Vitamin (MULTIVITAMIN) capsule Take 1 capsule by mouth daily.     Yes [provider]  naproxen sodium (ALEVE) 220 MG tablet Take 220-440 mg by mouth 2 (two) times daily as needed (pain).   Yes [provider]  omeprazole (PRILOSEC) 40 MG capsule Take 40 mg by mouth daily.   Yes [provider]    Allergies as of 05/03/2020  . (No Known Allergies)    Family History  Problem Relation Age of Onset  . Heart attack Father   . Colon cancer Neg Hx     Social History   Socioeconomic History  . Marital status: Widowed    Spouse name: Not on file  . Number of children: Not  on file  . Years of education: Not on file  . Highest education level: Not on file  Occupational History  . Occupation: retired    Fish farm manager: [employer]    Comment: post office  Tobacco Use  . Smoking status: Current Every Day Smoker    Years: 15.00    Types: Cigars  . Smokeless tobacco: Never Used  . Tobacco comment: 2 cigars daily  Substance and Sexual Activity  . Alcohol use: Yes    Alcohol/week: 14.0 standard drinks    Types: 7 Glasses of wine, 7 Shots of liquor per week    Comment: 2 glasses of wine every night and glass of bourbon  . Drug use: No  . Sexual activity: Not on file  Other Topics  Concern  . Not on file  Social History Narrative  . Not on file   Social Determinants of Health   Financial Resource Strain:   . Difficulty of Paying Living Expenses: Not on file  Food Insecurity:   . Worried About Charity fundraiser in the Last Year: Not on file  . Ran Out of Food in the Last Year: Not on file  Transportation Needs:   . Lack of Transportation (Medical): Not on file  . Lack of Transportation (Non-Medical): Not on file  Physical Activity:   . Days of Exercise per Week: Not on file  . Minutes of Exercise per Session: Not on file  Stress:   . Feeling of Stress : Not on file  Social Connections:   . Frequency of Communication with Friends and Family: Not on file  . Frequency of Social Gatherings with Friends and Family: Not on file  . Attends Religious Services: Not on file  . Active Member of Clubs or Organizations: Not on file  . Attends Archivist Meetings: Not on file  . Marital Status: Not on file  Intimate Partner Violence:   . Fear of Current or Ex-Partner: Not on file  . Emotionally Abused: Not on file  . Physically Abused: Not on file  . Sexually Abused: Not on file    Review of Systems: See HPI, otherwise negative ROS  Physical Exam: There were no vitals taken for this visit. General:   Alert,  Well-developed, well-nourished, pleasant and cooperative in NAD Neck:  Supple; no masses or thyromegaly. No significant cervical adenopathy. Lungs:  Clear throughout to auscultation.   No wheezes, crackles, or rhonchi. No acute distress. Heart:  Regular rate and rhythm; no murmurs, clicks, rubs,  or gallops. Abdomen: Non-distended, normal bowel sounds.  Soft and nontender without appreciable mass or hepatosplenomegaly.  Pulses:  Normal pulses noted. Extremities:  Without clubbing or edema.  Impression/Plan: 69 year old gentleman history colonic adenomas removed 2018.  Here for surveillance colonoscopy per plan.  The risks, benefits,  limitations, alternatives and imponderables have been reviewed with the patient. Questions have been answered. All parties are agreeable.      Notice: This dictation was prepared with Dragon dictation along with smaller phrase technology. Any transcriptional errors that result from this process are unintentional and may not be corrected upon review.

## 2020-07-08 NOTE — Op Note (Signed)
[facility] Patient Name: Shane [name] Procedure Date: 07/08/2020 11:08 AM MRN: [id number] Date of Birth: 09/26/1950 Attending MD: Norvel Richards , MD CSN: [id number] Age: 69 Admit Type: Outpatient Procedure:                Colonoscopy Indications:              High risk colon cancer surveillance: Personal                            history of colonic polyps Providers:                Norvel Richards, MD, Charlsie Quest. Theda Sers RN, RN,                            Nelma Rothman, Technician Referring MD:              Medicines:                Monitored Anesthesia Care Complications:            No immediate complications. Estimated Blood Loss:     Estimated blood loss: none. Procedure:                Pre-Anesthesia Assessment:                           - Prior to the procedure, a History and Physical                            was performed, and patient medications and                            allergies were reviewed. The patient's tolerance of                            previous anesthesia was also reviewed. The risks                            and benefits of the procedure and the sedation                            options and risks were discussed with the patient.                            All questions were answered, and informed consent                            was obtained. Prior Anticoagulants: The patient has                            taken no previous anticoagulant or antiplatelet                            agents. ASA Grade Assessment: II - A patient with  mild systemic disease. After reviewing the risks                            and benefits, the patient was deemed in                            satisfactory condition to undergo the procedure.                           After obtaining informed consent, the colonoscope                            was passed under direct vision. Throughout the                            procedure, the  patient's blood pressure, pulse, and                            oxygen saturations were monitored continuously. The                            CF-[id number] ([id number]) scope was introduced through                            the anus and advanced to the the cecum, identified                            by appendiceal orifice and ileocecal valve. The                            colonoscopy was performed without difficulty. The                            patient tolerated the procedure well. The quality                            of the bowel preparation was adequate. Scope In: 11:26:28 AM Scope Out: 11:38:58 AM Scope Withdrawal Time: 0 hours 8 minutes 30 seconds  Total Procedure Duration: 0 hours 12 minutes 30 seconds  Findings:      The perianal and digital rectal examinations were normal.      Scattered small and large-mouthed diverticula were found in the entire       colon.      The exam was otherwise without abnormality on direct and retroflexion       views. Impression:               - Diverticulosis in the entire examined colon.                           - The examination was otherwise normal on direct                            and retroflexion views.                           -  No specimens collected. Moderate Sedation:      Moderate (conscious) sedation was personally administered by an       anesthesia professional. The following parameters were monitored: oxygen       saturation, heart rate, blood pressure, respiratory rate, EKG, adequacy       of pulmonary ventilation, and response to care. Recommendation:           - Patient has a contact number available for                            emergencies. The signs and symptoms of potential                            delayed complications were discussed with the                            patient. Return to normal activities tomorrow.                            Written discharge instructions were provided to the                             patient.                           - Resume previous diet.                           - Continue present medications.                           - Repeat colonoscopy in 5 years for surveillance.                           - Return to GI office (date not yet determined). Procedure Code(s):        --- Professional ---                           [phone], Colonoscopy, flexible; diagnostic, including                            collection of specimen(s) by brushing or washing,                            when performed (separate procedure) Diagnosis Code(s):        --- Professional ---                           Z86.010, Personal history of colonic polyps                           K57.30, Diverticulosis of large intestine without                            perforation or abscess without bleeding CPT copyright 2019 American Medical Association. All rights reserved. The codes documented in  this report are preliminary and upon coder review may  be revised to meet current compliance requirements. Cristopher Estimable. Troyce Gieske, MD Norvel Richards, MD 07/08/2020 11:53:07 AM This report has been signed electronically. Number of Addenda: 0

## 2020-07-08 NOTE — Transfer of Care (Signed)
Immediate Anesthesia Transfer of Care Note  Patient: Shane [name]  Procedure(s) Performed: COLONOSCOPY WITH PROPOFOL (N/A )  Patient Location: PACU  Anesthesia Type:General  Level of Consciousness: awake  Airway & Oxygen Therapy: Patient Spontanous Breathing  Post-op Assessment: Report given to RN  Post vital signs: Reviewed and stable  Last Vitals:  Vitals Value Taken Time  BP 98/49 07/08/20 1145  Temp    Pulse 75 07/08/20 1147  Resp 17 07/08/20 1147  SpO2 98 % 07/08/20 1147  Vitals shown include unvalidated device data.  Last Pain:  Vitals:   07/08/20 1126  TempSrc:   PainSc: 0-No pain         Complications: No complications documented.

## 2020-07-08 NOTE — Anesthesia Postprocedure Evaluation (Signed)
Anesthesia Post Note  Patient: Shane [name]  Procedure(s) Performed: COLONOSCOPY WITH PROPOFOL (N/A )  Anesthesia Type: General Level of consciousness: awake and alert and oriented Pain management: pain level controlled Vital Signs Assessment: post-procedure vital signs reviewed and stable Respiratory status: spontaneous breathing Cardiovascular status: blood pressure returned to baseline and stable Postop Assessment: no apparent nausea or vomiting Anesthetic complications: no   No complications documented.   Last Vitals:  Vitals:   07/08/20 1107 07/08/20 1145  BP: 124/68 (!) 98/49  Pulse: 94 82  Resp: (!) 22 15  Temp: 36.6 C (P) 36.7 C  SpO2: 99% 96%    Last Pain:  Vitals:   07/08/20 1126  TempSrc:   PainSc: 0-No pain                 Shane [name]

## 2020-07-11 ENCOUNTER — Encounter (HOSPITAL_COMMUNITY): Payer: Self-pay | Admitting: Internal Medicine

## 2020-09-12 ENCOUNTER — Telehealth: Payer: Self-pay

## 2020-09-12 NOTE — Telephone Encounter (Signed)
NOTES ON FILE FROM [facility] [phone], SENT REFERRAL TO SCHEDULING

## 2020-11-05 ENCOUNTER — Encounter: Payer: Self-pay | Admitting: Cardiovascular Disease

## 2020-11-05 ENCOUNTER — Ambulatory Visit (INDEPENDENT_AMBULATORY_CARE_PROVIDER_SITE_OTHER): Payer: No Typology Code available for payment source | Admitting: Cardiovascular Disease

## 2020-11-05 ENCOUNTER — Other Ambulatory Visit: Payer: Self-pay

## 2020-11-05 DIAGNOSIS — I10 Essential (primary) hypertension: Secondary | ICD-10-CM

## 2020-11-05 DIAGNOSIS — F17201 Nicotine dependence, unspecified, in remission: Secondary | ICD-10-CM | POA: Diagnosis not present

## 2020-11-05 DIAGNOSIS — E782 Mixed hyperlipidemia: Secondary | ICD-10-CM | POA: Diagnosis not present

## 2020-11-05 DIAGNOSIS — I401 Isolated myocarditis: Secondary | ICD-10-CM

## 2020-11-05 MED ORDER — LABETALOL HCL 100 MG PO TABS: ORAL_TABLET | ORAL | 3 refills | Status: DC

## 2020-11-05 NOTE — Assessment & Plan Note (Signed)
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 He apparently had a normal cath in his early 18s of an aorta.

## 2020-11-05 NOTE — Assessment & Plan Note (Signed)
History of essential potential blood pressure more pronounced in the mornings. He is on hydrochlorothiazide 50 mg in the morning, labetalol 100 mg p.o. twice daily and losartan 100 mg in the evening. I am can increase his labetalol to 200 mg in the morning, will ask him to keep a 30-day blood pressure log and will have him see a Pharm.D. back in 1 month to review. Go to get renal Doppler studies as well.

## 2020-11-05 NOTE — Patient Instructions (Signed)
Medication Instructions:   Increase labetalol to 200mg  in the morning and 100mg  in the evening.   *If you need a refill on your cardiac medications before your next appointment, please call your pharmacy*   Testing/Procedures: Your physician has requested that you have a renal artery duplex. During this test, an ultrasound is used to evaluate blood flow to the kidneys. Allow one hour for this exam. Do not eat after midnight the day before and avoid carbonated beverages. Take your medications as you usually do. This procedure is done at [facility] [address]    Follow-Up: At [facility], you and your health needs are our priority.  As part of our continuing mission to provide you with exceptional heart care, we have created designated Provider Care Teams.  These Care Teams include your primary Cardiologist (physician) and Advanced Practice Providers (APPs -  Physician Assistants and Nurse Practitioners) who all work together to provide you with the care you need, when you need it.  We recommend signing up for the patient portal called "MyChart".  Sign up information is provided on this After Visit Summary.  MyChart is used to connect with patients for Virtual Visits (Telemedicine).  Patients are able to view lab/test results, encounter notes, upcoming appointments, etc.  Non-urgent messages can be sent to your provider as well.   To learn more about what you can do with MyChart, go to [email or web address].    Your next appointment:   3 month(s)  The format for your next appointment:   In Person  Provider:   Quay Burow, MD   Other Instructions Please keep a 30 day blood pressure log and return in 1 month to see a PharmD for hypertension.

## 2020-11-05 NOTE — Assessment & Plan Note (Signed)
History of ongoing tobacco abuse of 2 cigars a day.

## 2020-11-05 NOTE — Assessment & Plan Note (Signed)
History of hyperlipidemia on statin therapy lipid profile performed 07/02/2020 revealing total cholesterol of 172, LDL of 93 HDL 58.

## 2020-11-05 NOTE — Progress Notes (Signed)
11/05/2020 Shane [name] [facility]   [birth year]  [id number]  Primary Physician Shane School, MD Primary Cardiologist: Shane Harp MD Shane [name], [place]  HPI:  Shane [name] is a 70 y.o. mildly overweight widowed Caucasian male father 25, grandfather 2 grandchildren referred by Dr. Gerarda [name] for cardiovascular valuation because of hypertension. 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 This is factors include tobacco abuse hyperlipidemia. There is no family history Shane [name] is never had a heart attack or stroke. He denies chest pain but does get short of breath on occasion. He had an acute pericarditis in his early 57s and again in his early 59s and apparently had a normal cath in [place] at that time. He saw Dr. Carlyle [name] in [place] 5 years ago who mentioned that he had hypertension but was on no medications at that time.   Current Meds  Medication Sig  . aspirin EC 81 MG tablet Take 81 mg by mouth daily.  Marland Kitchen atorvastatin (LIPITOR) 40 MG tablet Take 1 tablet (40 mg total) by mouth daily.  Marland Kitchen doxycycline (VIBRAMYCIN) 50 MG capsule Take 50 mg by mouth daily.  . hyoscyamine (LEVSIN) 0.125 MG tablet Take 0.125 mg by mouth daily as needed (GI pain).  Marland Kitchen ketotifen (ZADITOR) 0.025 % ophthalmic solution Place 1 drop into both eyes daily as needed (allergies).  . loratadine (CLARITIN) 10 MG tablet Take 10 mg by mouth daily as needed for allergies.  Marland Kitchen losartan (COZAAR) 100 MG tablet Take 100 mg by mouth daily.  Marland Kitchen MINOXIDIL, TOPICAL, 5 % SOLN Apply 1 application topically daily.  . Multiple Vitamin (MULTIVITAMIN) capsule Take 1 capsule by mouth daily.  . naproxen sodium (ALEVE) 220 MG tablet Take 220-440 mg by mouth 2 (two) times daily as needed (pain).  Marland Kitchen omeprazole (PRILOSEC) 40 MG capsule Take 40 mg by mouth daily.     No Known Allergies  Social History   Socioeconomic History  . Marital status: Widowed    Spouse name: Not on file  . Number of children:  Not on file  . Years of education: Not on file  . Highest education level: Not on file  Occupational History  . Occupation: retired    Fish farm manager: [employer]    Comment: post office  Tobacco Use  . Smoking status: Current Every Day Smoker    Years: 15.00    Types: Cigars  . Smokeless tobacco: Never Used  . Tobacco comment: 2 cigars daily  Substance and Sexual Activity  . Alcohol use: Yes    Alcohol/week: 14.0 standard drinks    Types: 7 Glasses of wine, 7 Shots of liquor per week    Comment: 2 glasses of wine every night and glass of bourbon  . Drug use: No  . Sexual activity: Not on file  Other Topics Concern  . Not on file  Social History Narrative  . Not on file   Social Determinants of Health   Financial Resource Strain: Not on file  Food Insecurity: Not on file  Transportation Needs: Not on file  Physical Activity: Not on file  Stress: Not on file  Social Connections: Not on file  Intimate Partner Violence: Not on file     Review of Systems: General: negative for chills, fever, night sweats or weight changes.  Cardiovascular: negative for chest pain, dyspnea on exertion, edema, orthopnea, palpitations, paroxysmal nocturnal dyspnea or shortness of breath Dermatological: negative for rash Respiratory: negative for cough or  wheezing Urologic: negative for hematuria Abdominal: negative for nausea, vomiting, diarrhea, bright red blood per rectum, melena, or hematemesis Neurologic: negative for visual changes, syncope, or dizziness All other systems reviewed and are otherwise negative except as noted above.    Blood pressure (!) 150/82, pulse 74, height 5\' 10"  (1.778 m), weight 212 lb (96.2 kg).  General appearance: alert and no distress Neck: no adenopathy, no carotid bruit, no JVD, supple, symmetrical, trachea midline and thyroid not enlarged, symmetric, no tenderness/mass/nodules Lungs: clear to auscultation bilaterally Heart: regular rate and rhythm, S1, S2  normal, no murmur, click, rub or gallop Extremities: extremities normal, atraumatic, no cyanosis or edema Pulses: 2+ and symmetric Skin: Skin color, texture, turgor normal. No rashes or lesions Neurologic: Alert and oriented X 3, normal strength and tone. Normal symmetric reflexes. Normal coordination and gait  EKG sinus rhythm at 74 without ST or T wave changes. Personally reviewed this EKG.  ASSESSMENT AND PLAN:   Hyperlipidemia History of hyperlipidemia on statin therapy lipid profile performed 07/02/2020 revealing total cholesterol of 172, LDL of 93 HDL 58.  Tobacco abuse, in remission History of ongoing tobacco abuse of 2 cigars a day.  MYOCARDITIS History of pericarditis at age 20 and again in his early 17s. He apparently had a normal cath in his early 86s of an aorta.  Hypertension History of essential potential blood pressure more pronounced in the mornings. He is on hydrochlorothiazide 50 mg in the morning, labetalol 100 mg p.o. twice daily and losartan 100 mg in the evening. I am can increase his labetalol to 200 mg in the morning, will ask him to keep a 30-day blood pressure log and will have him see a Pharm.D. back in 1 month to review. Go to get renal Doppler studies as well.      Shane Harp MD FACP,FACC,FAHA, [facility] 11/05/2020 4:46 PM

## 2020-11-14 ENCOUNTER — Ambulatory Visit (HOSPITAL_COMMUNITY)
Admission: RE | Admit: 2020-11-14 | Discharge: 2020-11-14 | Disposition: A | Discharge status: Home / Self Care | Payer: No Typology Code available for payment source | Source: Ambulatory Visit | Attending: Cardiovascular Disease | Admitting: Cardiovascular Disease

## 2020-11-14 ENCOUNTER — Other Ambulatory Visit: Payer: Self-pay

## 2020-11-14 DIAGNOSIS — F17201 Nicotine dependence, unspecified, in remission: Secondary | ICD-10-CM | POA: Diagnosis present

## 2020-11-14 DIAGNOSIS — E782 Mixed hyperlipidemia: Secondary | ICD-10-CM | POA: Diagnosis present

## 2020-11-14 DIAGNOSIS — I10 Essential (primary) hypertension: Secondary | ICD-10-CM | POA: Insufficient documentation

## 2020-12-03 ENCOUNTER — Other Ambulatory Visit: Payer: Self-pay

## 2020-12-03 ENCOUNTER — Ambulatory Visit (INDEPENDENT_AMBULATORY_CARE_PROVIDER_SITE_OTHER)
Payer: No Typology Code available for payment source | Admitting: Pharmacist Clinician (PhC)/ Clinical Pharmacy Specialist

## 2020-12-03 DIAGNOSIS — I10 Essential (primary) hypertension: Secondary | ICD-10-CM | POA: Diagnosis not present

## 2020-12-03 MED ORDER — OLMESARTAN MEDOXOMIL 20 MG PO TABS: 20.0000 mg | ORAL_TABLET | Freq: Every day | ORAL | 6 refills | Status: DC

## 2020-12-03 NOTE — Patient Instructions (Signed)
Return for a a follow up appointment April 19 at 9:30   Check your blood pressure at home daily and keep record of the readings.  Take your BP meds as follows:  STOP LOSARTAN  START OLMESARTAN 20 MG ONCE DAILY IN THE EVENINGS   Continue with all other medications  Bring all of your meds, your BP cuff and your record of home blood pressures to your next appointment.  Exercise as you're able, try to walk approximately 30 minutes per day.  Keep salt intake to a minimum, especially watch canned and prepared boxed foods.  Eat more fresh fruits and vegetables and fewer canned items.  Avoid eating in fast food restaurants.    HOW TO TAKE YOUR BLOOD PRESSURE: . Rest 5 minutes before taking your blood pressure. .  Don't smoke or drink caffeinated beverages for at least 30 minutes before. . Take your blood pressure before (not after) you eat. . Sit comfortably with your back supported and both feet on the floor (don't cross your legs). . Elevate your arm to heart level on a table or a desk. . Use the proper sized cuff. It should fit smoothly and snugly around your bare upper arm. There should be enough room to slip a fingertip under the cuff. The bottom edge of the cuff should be 1 inch above the crease of the elbow. . Ideally, take 3 measurements at one sitting and record the average.

## 2020-12-03 NOTE — Progress Notes (Signed)
12/06/2020 Cullen [facility] 09-29-1950 [id number]   HPI:  Shane [name] is a 70 y.o. male patient of Dr [name], with a PMH below who presents today for hypertension clinic evaluation.  He was referred to cardiology by his PCP in [place], Dr. Gerarda Fraction, because of hypertension.  When he saw Dr. [name] his BP was noted to be 150/82 and he was taking losartan 100 mg qd, hctz 50 mg qd and labetalol 100 mg bid.  Dr. [name] increased the labetalol to 200 mg just in the mornings (continue 100 mg at night) and asked that he monitor home BP readings and follow up with CVRR after a month.    He returns today with his record of blood pressure readings.  Has no complaints about his medications and has been feeling well overall.  States he has been on medication to control his pressure for about 10-15 years, and has never really had much problem with it being too high.    Past Medical History: hyperlipidemia 10/21 LDL 93 on atorvastatin 40  myocarditis Pericarditis x 2 (21, early 40's)     Blood Pressure Goal:  130/80  Current Medications: hctz 50 mg qd - am, labetalol 200 mg qam, 100 mg qhs, losartan 100 mg - hs  Family Hx: father with CABG around age 60 died at 56, mother had cardilogist, died at 50; brother (oldest) has cardiomyopathy; 2 sons, no issues  Social Hx: cigars;  drinks wine, scotch most days; espresso x 3 every morning  Diet: home cooked mostly, [place], barbecue, asian cooking; does use salt with cooking, only occasionally at table; more protiens than veggies; veggies are mostly fresh  Exercise:  No regular exercise, owns 54 acres, cutting trees, pool care, property care  Home BP readings:   AM - 26 readings average 138/85  1-3 pm - 23 readings average 142/86  PM - 21 readings average 111/70  Intolerances: nkda  Labs: will reach out to Dr. Gerarda Fraction for most recent metabolic panel   Wt Readings from Last 3 Encounters:  12/03/20 211 lb (95.7 kg)  11/05/20 212 lb (96.2  kg)  07/08/20 204 lb (92.5 kg)   BP Readings from Last 3 Encounters:  12/03/20 140/80  11/05/20 (!) 150/82  07/08/20 125/73   Pulse Readings from Last 3 Encounters:  12/03/20 78  11/05/20 74  07/08/20 78    Current Outpatient Medications  Medication Sig Dispense Refill  . aspirin EC 81 MG tablet Take 81 mg by mouth daily.    Marland Kitchen atorvastatin (LIPITOR) 40 MG tablet Take 1 tablet (40 mg total) by mouth daily. 90 tablet 3  . doxycycline (VIBRAMYCIN) 50 MG capsule Take 50 mg by mouth daily.    . hydrochlorothiazide (HYDRODIURIL) 50 MG tablet Take 50 mg by mouth daily.    . hyoscyamine (LEVSIN) 0.125 MG tablet Take 0.125 mg by mouth daily as needed (GI pain).    Marland Kitchen ketotifen (ZADITOR) 0.025 % ophthalmic solution Place 1 drop into both eyes daily as needed (allergies).    . labetalol (NORMODYNE) 100 MG tablet Take 2 tablets (200 mg total) by mouth in the morning AND 1 tablet (100 mg total) every evening. 270 tablet 3  . loratadine (CLARITIN) 10 MG tablet Take 10 mg by mouth daily as needed for allergies.    Marland Kitchen MINOXIDIL, TOPICAL, 5 % SOLN Apply 1 application topically daily.    . Multiple Vitamin (MULTIVITAMIN) capsule Take 1 capsule by mouth daily.    . naproxen  sodium (ALEVE) 220 MG tablet Take 220-440 mg by mouth 2 (two) times daily as needed (pain).    Marland Kitchen olmesartan (BENICAR) 20 MG tablet Take 1 tablet (20 mg total) by mouth daily. 30 tablet 6  . omeprazole (PRILOSEC) 40 MG capsule Take 40 mg by mouth daily.     No current facility-administered medications for this visit.    No Known Allergies  Past Medical History:  Diagnosis Date  . Anal fissure   . Hx of adenomatous colonic polyps    per pt report many years ago  . Hyperlipidemia   . Hypertension   . Myocarditis ([place])    Has history of MI x2, but no documentation of same; h/o coronary angiography, but results of study are not available.  . Nasal fracture    S/P   . Prostatic hypertrophy, benign   . Tobacco abuse, in  remission    cigars x2 a day    Blood pressure 140/80, pulse 78, resp. rate 14, height 5' 9.5" (1.765 m), weight 211 lb (95.7 kg), SpO2 98 %.  Hypertension Patient with essential hypertension, still not fully controlled on current medications.  Rather than add to his existing list, will have him switch the losartan to olmesartan 20 mg.  Can plan to further up-titrate to 40 mg should his pressure remain elevated at next visit.  Would also consider decreasing hctz to 25 mg, as there is no known benefit to the 50 mg dose, but will wait until next visit to see how he is doing.   He should continue with regular home monitoring and return in 1 month for follow up.   Of note he did have one low BP reading (75/48) in the past month, he was advised to have a small salty snack and some water should this happen again.     Tommy Medal PharmD CPP [facility] [address] [place],  [address] [phone]

## 2020-12-06 ENCOUNTER — Encounter: Payer: Self-pay | Admitting: Pharmacist Clinician (PhC)/ Clinical Pharmacy Specialist

## 2020-12-06 NOTE — Assessment & Plan Note (Signed)
Patient with essential hypertension, still not fully controlled on current medications.  Rather than add to his existing list, will have him switch the losartan to olmesartan 20 mg.  Can plan to further up-titrate to 40 mg should his pressure remain elevated at next visit.  Would also consider decreasing hctz to 25 mg, as there is no known benefit to the 50 mg dose, but will wait until next visit to see how he is doing.   He should continue with regular home monitoring and return in 1 month for follow up.   Of note he did have one low BP reading (75/48) in the past month, he was advised to have a small salty snack and some water should this happen again.

## 2020-12-09 ENCOUNTER — Telehealth: Payer: Self-pay

## 2020-12-09 NOTE — Telephone Encounter (Signed)
Called and spoke w/staff member to retrieve labs as requested by kristin alvstad pharmd cpp

## 2020-12-31 ENCOUNTER — Ambulatory Visit (INDEPENDENT_AMBULATORY_CARE_PROVIDER_SITE_OTHER): Payer: No Typology Code available for payment source | Admitting: Pharmacist

## 2020-12-31 ENCOUNTER — Other Ambulatory Visit: Payer: Self-pay

## 2020-12-31 VITALS — BP 142/86 | HR 69 | Resp 17 | Ht 69.5 in | Wt 211.6 lb

## 2020-12-31 DIAGNOSIS — I10 Essential (primary) hypertension: Secondary | ICD-10-CM

## 2020-12-31 DIAGNOSIS — Z79899 Other long term (current) drug therapy: Secondary | ICD-10-CM

## 2020-12-31 LAB — BASIC METABOLIC PANEL
BUN/Creatinine Ratio: 24 (ref 10–24)
BUN: 37 mg/dL — ABNORMAL HIGH (ref 8–27)
CO2: 21 mmol/L (ref 20–29)
Calcium: 9.2 mg/dL (ref 8.6–10.2)
Chloride: 104 mmol/L (ref 96–106)
Creatinine, Ser: 1.56 mg/dL — ABNORMAL HIGH (ref 0.76–1.27)
Glucose: 116 mg/dL — ABNORMAL HIGH (ref 65–99)
Potassium: 4.4 mmol/L (ref 3.5–5.2)
Sodium: 140 mmol/L (ref 134–144)
eGFR: 47 mL/min/{1.73_m2} — ABNORMAL LOW (ref >59)

## 2020-12-31 NOTE — Progress Notes (Signed)
HPI:  Shane [name] is a 70 y.o. male patient of Dr [name], with a PMH below who presents today for hypertension clinic follow up.  He was referred to cardiology by his PCP in [place], Dr. Gerarda Fraction, because of hypertension.  When he saw Dr. [name] his BP was noted to be 150/82 and he was taking losartan 100 mg qd, hctz 50 mg qd and labetalol 100 mg bid.  Dr. [name] increased the labetalol to 200 mg just in the mornings (continue 100 mg at night) and asked that he monitor home BP readings and follow up with CVRR. Duroing last OV with pharmD, his losartan was changed to olmesartan 20mg  daily.  He returns today with his record of blood pressure readings.    Past Medical History: hyperlipidemia 10/21 LDL 93 on atorvastatin 40  myocarditis Pericarditis x 2 (21, early 40's)     Blood Pressure Goal:  130/80  Current Medications:  hctz 50 mg daily   labetalol 200 mg in AM &  100 mg at bedtime  Olmesartan 20mg  daily   Family Hx: father with CABG around age 2 died at 48, mother had cardilogist, died at 20; brother (oldest) has cardiomyopathy; 2 sons, no issues  Social Hx: cigars;  drinks wine, scotch most days; espresso x 3 every morning  Diet: home cooked mostly, [place], barbecue, asian cooking; does use salt with cooking, only occasionally at table; more protiens than veggies; veggies are mostly fresh  Exercise:  No regular exercise, owns 50 acres, cutting trees, pool care, property care  Home BP readings:  11 morning readings, average 138/85, HR range 78-93bpm 9 evening readings, average 110/69, HR range 77-86bpm  Intolerances: nkda  Wt Readings from Last 3 Encounters:  12/31/20 211 lb 9.6 oz (96 kg)  12/03/20 211 lb (95.7 kg)  11/05/20 212 lb (96.2 kg)   BP Readings from Last 3 Encounters:  12/31/20 (!) 142/86  12/03/20 140/80  11/05/20 (!) 150/82   Pulse Readings from Last 3 Encounters:  12/31/20 69  12/03/20 78  11/05/20 74    Current Outpatient Medications   Medication Sig Dispense Refill  . aspirin EC 81 MG tablet Take 81 mg by mouth daily.    Marland Kitchen atorvastatin (LIPITOR) 40 MG tablet Take 1 tablet (40 mg total) by mouth daily. 90 tablet 3  . doxycycline (VIBRAMYCIN) 50 MG capsule Take 50 mg by mouth daily.    . hyoscyamine (LEVSIN) 0.125 MG tablet Take 0.125 mg by mouth daily as needed (GI pain).    Marland Kitchen ketotifen (ZADITOR) 0.025 % ophthalmic solution Place 1 drop into both eyes daily as needed (allergies).    . labetalol (NORMODYNE) 100 MG tablet Take 2 tablets (200 mg total) by mouth in the morning AND 1 tablet (100 mg total) every evening. 270 tablet 3  . loratadine (CLARITIN) 10 MG tablet Take 10 mg by mouth daily as needed for allergies.    Marland Kitchen MINOXIDIL, TOPICAL, 5 % SOLN Apply 1 application topically daily.    . Multiple Vitamin (MULTIVITAMIN) capsule Take 1 capsule by mouth daily.    . naproxen sodium (ALEVE) 220 MG tablet Take 220-440 mg by mouth 2 (two) times daily as needed (pain).    Marland Kitchen olmesartan (BENICAR) 20 MG tablet Take 1 tablet (20 mg total) by mouth daily. 30 tablet 6  . omeprazole (PRILOSEC) 40 MG capsule Take 40 mg by mouth daily.    . hydrochlorothiazide (HYDRODIURIL) 25 MG tablet Take 1 tablet (25 mg total) by  mouth daily. 90 tablet 1   No current facility-administered medications for this visit.    No Known Allergies  Past Medical History:  Diagnosis Date  . Anal fissure   . Hx of adenomatous colonic polyps    per pt report many years ago  . Hyperlipidemia   . Hypertension   . Myocarditis ([place])    Has history of MI x2, but no documentation of same; h/o coronary angiography, but results of study are not available.  . Nasal fracture    S/P   . Prostatic hypertrophy, benign   . Tobacco abuse, in remission    cigars x2 a day    Blood pressure (!) 142/86, pulse 69, resp. rate 17, height 5' 9.5" (1.765 m), weight 211 lb 9.6 oz (96 kg), SpO2 95 %.  Hypertension Blood pressure remains above goal during office visit. Noted  home readings show above goal morning BP and at goal evening BP reading. Some of the evening reading are as low as 93 systolic.  Will continue all current medication, but change labetalol administration to 100mg  in AM and 200mg  in PM. Follow up in 1 month with Dr [name] and 4 weeks with HTN clinic if needed. Plan to increase olmesartan dose to 40mg  daily if additional BP control needed.   Shane [name] PharmD, BCPS, CPP [facility] [id number] 01/06/2021 2:23 PM

## 2020-12-31 NOTE — Patient Instructions (Addendum)
Return for a  follow up appointment in 1 month with Dr [name]  Check your blood pressure at home daily (if able) and keep record of the readings.  Take your BP meds as follows: *CHANGE labetalol to 100mg  in the morning and 200mg  in the evening*  Bring all of your meds, your BP cuff and your record of home blood pressures to your next appointment.  Exercise as you're able, try to walk approximately 30 minutes per day.  Keep salt intake to a minimum, especially watch canned and prepared boxed foods.  Eat more fresh fruits and vegetables and fewer canned items.  Avoid eating in fast food restaurants.    HOW TO TAKE YOUR BLOOD PRESSURE: . Rest 5 minutes before taking your blood pressure. .  Don't smoke or drink caffeinated beverages for at least 30 minutes before. . Take your blood pressure before (not after) you eat. . Sit comfortably with your back supported and both feet on the floor (don't cross your legs). . Elevate your arm to heart level on a table or a desk. . Use the proper sized cuff. It should fit smoothly and snugly around your bare upper arm. There should be enough room to slip a fingertip under the cuff. The bottom edge of the cuff should be 1 inch above the crease of the elbow. . Ideally, take 3 measurements at one sitting and record the average.

## 2021-01-01 ENCOUNTER — Telehealth: Payer: Self-pay

## 2021-01-01 DIAGNOSIS — I10 Essential (primary) hypertension: Secondary | ICD-10-CM

## 2021-01-01 MED ORDER — HYDROCHLOROTHIAZIDE 25 MG PO TABS: 25.0000 mg | ORAL_TABLET | Freq: Every day | ORAL | 1 refills | Status: DC

## 2021-01-01 NOTE — Telephone Encounter (Signed)
Called and spoke w/pt regarding labs and the need to decrease hctz to 25mg  and to repeat labs in 2 weeks for a bmet. Pt voiced understanding

## 2021-01-06 ENCOUNTER — Encounter: Payer: Self-pay | Admitting: Pharmacist

## 2021-01-06 NOTE — Assessment & Plan Note (Signed)
Blood pressure remains above goal during office visit. Noted home readings show above goal morning BP and at goal evening BP reading. Some of the evening reading are as low as 93 systolic.  Will continue all current medication, but change labetalol administration to 100mg  in AM and 200mg  in PM. Follow up in 1 month with Dr [name] and 4 weeks with HTN clinic if needed. Plan to increase olmesartan dose to 40mg  daily if additional BP control needed.

## 2021-01-20 LAB — BASIC METABOLIC PANEL
BUN/Creatinine Ratio: 20 (ref 10–24)
BUN: 28 mg/dL — ABNORMAL HIGH (ref 8–27)
CO2: 22 mmol/L (ref 20–29)
Calcium: 8.7 mg/dL (ref 8.6–10.2)
Chloride: 104 mmol/L (ref 96–106)
Creatinine, Ser: 1.4 mg/dL — ABNORMAL HIGH (ref 0.76–1.27)
Glucose: 118 mg/dL — ABNORMAL HIGH (ref 65–99)
Potassium: 4.5 mmol/L (ref 3.5–5.2)
Sodium: 139 mmol/L (ref 134–144)
eGFR: 54 mL/min/{1.73_m2} — ABNORMAL LOW (ref >59)

## 2021-02-04 ENCOUNTER — Telehealth: Payer: Self-pay | Admitting: Cardiovascular Disease

## 2021-02-04 ENCOUNTER — Ambulatory Visit (INDEPENDENT_AMBULATORY_CARE_PROVIDER_SITE_OTHER): Payer: No Typology Code available for payment source | Admitting: Cardiovascular Disease

## 2021-02-04 ENCOUNTER — Encounter: Payer: Self-pay | Admitting: Cardiovascular Disease

## 2021-02-04 ENCOUNTER — Other Ambulatory Visit: Payer: Self-pay

## 2021-02-04 DIAGNOSIS — E782 Mixed hyperlipidemia: Secondary | ICD-10-CM | POA: Diagnosis not present

## 2021-02-04 DIAGNOSIS — I10 Essential (primary) hypertension: Secondary | ICD-10-CM | POA: Diagnosis not present

## 2021-02-04 MED ORDER — HYDROCHLOROTHIAZIDE 25 MG PO TABS: 12.5000 mg | ORAL_TABLET | Freq: Every day | ORAL | 1 refills | Status: DC

## 2021-02-04 MED ORDER — HYDROCHLOROTHIAZIDE 12.5 MG PO CAPS: 12.5000 mg | ORAL_CAPSULE | Freq: Every day | ORAL | 3 refills | Status: DC

## 2021-02-04 NOTE — Assessment & Plan Note (Addendum)
History of essential hypertension much better controlled on his current medications although his serum creatinine did go up to 1.4.  He is on hydrochlorothiazide 25 mg a day elevated labetalol 200 mg twice daily and Benicar 20 mg a day.  I am going to drop his hydrochlorothiazide down to 12.5 mg a day and we will recheck a basic metabolic panel in 2 weeks.  Of note, his renal Doppler study performed 11/15/2020 showed no evidence of renal artery stenosis.

## 2021-02-04 NOTE — Patient Instructions (Addendum)
Medication Instructions:   - Decrease hydrochlorothiazide (hydrodiuril) 12.5mg  daily.  *If you need a refill on your cardiac medications before your next appointment, please call your pharmacy*   Lab Work: Your physician recommends that you return for lab work in: 2 weeks BMET  If you have labs (blood work) drawn today and your tests are completely normal, you will receive your results only by: Marland Kitchen MyChart Message (if you have MyChart) OR . A paper copy in the mail If you have any lab test that is abnormal or we need to change your treatment, we will call you to review the results.   Follow-Up: At [facility], you and your health needs are our priority.  As part of our continuing mission to provide you with exceptional heart care, we have created designated Provider Care Teams.  These Care Teams include your primary Cardiologist (physician) and Advanced Practice Providers (APPs -  Physician Assistants and Nurse Practitioners) who all work together to provide you with the care you need, when you need it.  We recommend signing up for the patient portal called "MyChart".  Sign up information is provided on this After Visit Summary.  MyChart is used to connect with patients for Virtual Visits (Telemedicine).  Patients are able to view lab/test results, encounter notes, upcoming appointments, etc.  Non-urgent messages can be sent to your provider as well.   To learn more about what you can do with MyChart, go to [email or web address].    Your next appointment:   6 month(s)  The format for your next appointment:   In Person  Provider:   You will see one of the following Advanced Practice Providers on your designated Care Team:    Sande Rives, PA-C  Coletta Memos, FNP  Then, Quay Burow, MD will plan to see you again in 12 month(s).

## 2021-02-04 NOTE — Telephone Encounter (Signed)
New message:    Patient was in the office today and states some one tried to call him some medication to the drug store. Patient would like to speak with some one concering this matter.

## 2021-02-04 NOTE — Assessment & Plan Note (Signed)
History of hyperlipidemia on statin therapy with lipid profile performed 07/02/2020 revealing total cholesterol 172, LDL 98 and HDL 58.

## 2021-02-04 NOTE — Progress Notes (Signed)
02/04/2021 [facility]   30-Dec-1950  [id number]  Primary Physician Redmond School, MD Primary Cardiologist: Lorretta Harp MD Lupe Carney, [place]  HPI:  Shane [name] is a 70 y.o.  mildly overweight widowed Caucasian male father 40, grandfather 2 grandchildren referred by Dr. Gerarda Fraction for cardiovascular valuation because of hypertension. 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  I last saw him in the office 11/05/2020.  This is factors include tobacco abuse hyperlipidemia. There is no family history [name] is never had a heart attack or stroke. He denies chest pain but does get short of breath on occasion. He had an acute pericarditis in his early 4s and again in his early 28s and apparently had a normal cath in [place] at that time. He saw Dr. Carlyle Dolly in [place] 5 years ago who mentioned that he had hypertension but was on no medications at that time.  Since I saw him 3 months ago he has been seeing our Pharm.D. for titration of antihypertensive medications.  A renal Doppler study performed 11/16/2019 was normal.  Blood pressures been under much better control on his current medications although his serum creatinine has increased to 1.4.   Current Meds  Medication Sig  . aspirin EC 81 MG tablet Take 81 mg by mouth daily.  Marland Kitchen atorvastatin (LIPITOR) 40 MG tablet Take 1 tablet (40 mg total) by mouth daily.  Marland Kitchen doxycycline (VIBRAMYCIN) 50 MG capsule Take 50 mg by mouth daily.  . hydrochlorothiazide (HYDRODIURIL) 25 MG tablet Take 1 tablet (25 mg total) by mouth daily.  . hyoscyamine (LEVSIN) 0.125 MG tablet Take 0.125 mg by mouth daily as needed (GI pain).  Marland Kitchen ketotifen (ZADITOR) 0.025 % ophthalmic solution Place 1 drop into both eyes daily as needed (allergies).  . labetalol (NORMODYNE) 100 MG tablet Take 2 tablets (200 mg total) by mouth in the morning AND 1 tablet (100 mg total) every evening. (Patient taking differently: Take 1 tablets (200 mg  total) by mouth in the morning AND  2 Tablets (100 mg total) every evening.)  . loratadine (CLARITIN) 10 MG tablet Take 10 mg by mouth daily as needed for allergies.  Marland Kitchen MINOXIDIL, TOPICAL, 5 % SOLN Apply 1 application topically daily.  . Multiple Vitamin (MULTIVITAMIN) capsule Take 1 capsule by mouth daily.  . naproxen sodium (ALEVE) 220 MG tablet Take 220-440 mg by mouth 2 (two) times daily as needed (pain).  Marland Kitchen olmesartan (BENICAR) 20 MG tablet Take 1 tablet (20 mg total) by mouth daily.  Marland Kitchen omeprazole (PRILOSEC) 40 MG capsule Take 40 mg by mouth daily.     No Known Allergies  Social History   Socioeconomic History  . Marital status: Widowed    Spouse name: Not on file  . Number of children: Not on file  . Years of education: Not on file  . Highest education level: Not on file  Occupational History  . Occupation: retired    Fish farm manager: [employer]    Comment: post office  Tobacco Use  . Smoking status: Current Every Day Smoker    Years: 15.00    Types: Cigars  . Smokeless tobacco: Never Used  . Tobacco comment: 2 cigars daily  Substance and Sexual Activity  . Alcohol use: Yes    Alcohol/week: 14.0 standard drinks    Types: 7 Glasses of wine, 7 Shots of liquor per week    Comment: 2 glasses of wine every night and glass  of bourbon  . Drug use: No  . Sexual activity: Not on file  Other Topics Concern  . Not on file  Social History Narrative  . Not on file   Social Determinants of Health   Financial Resource Strain: Not on file  Food Insecurity: Not on file  Transportation Needs: Not on file  Physical Activity: Not on file  Stress: Not on file  Social Connections: Not on file  Intimate Partner Violence: Not on file     Review of Systems: General: negative for chills, fever, night sweats or weight changes.  Cardiovascular: negative for chest pain, dyspnea on exertion, edema, orthopnea, palpitations, paroxysmal nocturnal dyspnea or shortness of  breath Dermatological: negative for rash Respiratory: negative for cough or wheezing Urologic: negative for hematuria Abdominal: negative for nausea, vomiting, diarrhea, bright red blood per rectum, melena, or hematemesis Neurologic: negative for visual changes, syncope, or dizziness All other systems reviewed and are otherwise negative except as noted above.    Blood pressure 128/68, pulse 84, height 5' 9.5" (1.765 m), weight 210 lb (95.3 kg).  General appearance: alert and no distress Neck: no adenopathy, no carotid bruit, no JVD, supple, symmetrical, trachea midline and thyroid not enlarged, symmetric, no tenderness/mass/nodules Lungs: clear to auscultation bilaterally Heart: regular rate and rhythm, S1, S2 normal, no murmur, click, rub or gallop Extremities: extremities normal, atraumatic, no cyanosis or edema Pulses: 2+ and symmetric Skin: Skin color, texture, turgor normal. No rashes or lesions Neurologic: Alert and oriented X 3, normal strength and tone. Normal symmetric reflexes. Normal coordination and gait  EKG not performed today  ASSESSMENT AND PLAN:   Hyperlipidemia History of hyperlipidemia on statin therapy with lipid profile performed 07/02/2020 revealing total cholesterol 172, LDL 98 and HDL 58.  Hypertension History of essential hypertension much better controlled on his current medications although his serum creatinine did go up to 1.4.  He is on hydrochlorothiazide 25 mg a day elevated labetalol 200 mg twice daily and Benicar 20 mg a day.  I am going to drop his hydrochlorothiazide down to 12.5 mg a day and we will recheck a basic metabolic panel in 2 weeks.  Of note, his renal Doppler study performed 11/15/2020 showed no evidence of renal artery stenosis.      Lorretta Harp MD FACP,FACC,FAHA, [facility] 02/04/2021 9:12 AM

## 2021-02-04 NOTE — Telephone Encounter (Signed)
Returned call to patient who states [facility] told him they cannot refill the hctz because the dose that was ordered was 25 mg and he recently had the Rx for that dose filled. He states he cannot split the 25 mg tablets in half because they turn to dust. I advised that I will send a Rx for hctz 12.5 mg capsule and to call back if he has further difficulty with getting his medication. He verbalized understanding and agreement and thanked me for the call.

## 2021-02-24 LAB — BASIC METABOLIC PANEL
BUN/Creatinine Ratio: 24 (ref 10–24)
BUN: 47 mg/dL — ABNORMAL HIGH (ref 8–27)
CO2: 19 mmol/L — ABNORMAL LOW (ref 20–29)
Calcium: 8.9 mg/dL (ref 8.6–10.2)
Chloride: 107 mmol/L — ABNORMAL HIGH (ref 96–106)
Creatinine, Ser: 1.92 mg/dL — ABNORMAL HIGH (ref 0.76–1.27)
Glucose: 123 mg/dL — ABNORMAL HIGH (ref 65–99)
Potassium: 4.2 mmol/L (ref 3.5–5.2)
Sodium: 141 mmol/L (ref 134–144)
eGFR: 37 mL/min/{1.73_m2} — ABNORMAL LOW (ref >59)

## 2021-03-04 ENCOUNTER — Telehealth: Payer: Self-pay

## 2021-03-04 DIAGNOSIS — I10 Essential (primary) hypertension: Secondary | ICD-10-CM

## 2021-03-04 NOTE — Telephone Encounter (Signed)
Patient calling back for results.  Thanks!

## 2021-03-04 NOTE — Addendum Note (Signed)
Addended by: Beatrix Fetters on: 03/04/2021 12:34 PM   Modules accepted: Orders

## 2021-03-04 NOTE — Telephone Encounter (Signed)
Spoke with pt regarding lab results and increase creatinine function. Discontinues HCTZ per Dr. [name]. Per Dr. [name] pt will need lab work again in 2 weeks. Pt verbalizes understanding and will come in 2 weeks for labs. HCTZ discontinued on pt's medication list. Lab orders placed.

## 2021-03-04 NOTE — Telephone Encounter (Signed)
Patient was returning call for results 

## 2021-03-04 NOTE — Telephone Encounter (Signed)
Left message for pt to call back regarding lab results. 

## 2021-03-04 NOTE — Telephone Encounter (Signed)
-----   Message from Lorretta Harp, MD sent at 02/24/2021  6:35 PM EDT ----- D/C HCTZ and recheck BMEt 2 weeks

## 2021-03-26 ENCOUNTER — Other Ambulatory Visit: Payer: Self-pay

## 2021-03-26 DIAGNOSIS — I10 Essential (primary) hypertension: Secondary | ICD-10-CM

## 2021-03-26 LAB — BASIC METABOLIC PANEL
BUN/Creatinine Ratio: 13 (ref 10–24)
BUN: 14 mg/dL (ref 8–27)
CO2: 22 mmol/L (ref 20–29)
Calcium: 8.4 mg/dL — ABNORMAL LOW (ref 8.6–10.2)
Chloride: 104 mmol/L (ref 96–106)
Creatinine, Ser: 1.08 mg/dL (ref 0.76–1.27)
Glucose: 114 mg/dL — ABNORMAL HIGH (ref 65–99)
Potassium: 4.1 mmol/L (ref 3.5–5.2)
Sodium: 140 mmol/L (ref 134–144)
eGFR: 73 mL/min/{1.73_m2} (ref >59)

## 2021-05-15 ENCOUNTER — Telehealth: Payer: Self-pay | Admitting: Cardiovascular Disease

## 2021-05-15 MED ORDER — OLMESARTAN MEDOXOMIL 40 MG PO TABS: 40.0000 mg | ORAL_TABLET | Freq: Every day | ORAL | 3 refills | Status: DC

## 2021-05-15 MED ORDER — LABETALOL HCL 100 MG PO TABS: 200.0000 mg | ORAL_TABLET | Freq: Two times a day (BID) | ORAL | 3 refills | Status: DC

## 2021-05-15 NOTE — Telephone Encounter (Signed)
Spoke with pt, aware of the recommendons. New script sent to the pharmacy. Follow up scheduled

## 2021-05-15 NOTE — Telephone Encounter (Signed)
Would increase olmesartan to '40mg'$  in the evenings and increase labetalol to '200mg'$  twice daily. He should continue to monitor BP at home, goal is < 130/56mHg. Would also schedule him f/u with PharmD in a few weeks for BP check and BMET, he has seen [facility] PharmD in the past.   If additional therapy is needed in the future, would recommend addition of amlodipine (HCTZ stopped due to notable increase in SCr).

## 2021-05-15 NOTE — Telephone Encounter (Signed)
Pt c/o BP issue: STAT if pt c/o blurred vision, one-sided weakness or slurred speech  1. What are your last 5 BP readings?  05/15/21 AM 170/100  Ranging 170's/100's for the past month  2. Are you having any other symptoms (ex. Dizziness, headache, blurred vision, passed out)? No, only gets a little dizzy when standing up quick from bending over   3. What is your BP issue? Hypertension

## 2021-05-15 NOTE — Telephone Encounter (Signed)
Spoke with pt, his average bp in the morning is 170/100 before medications and his evening bp after his medications is average 130/80. He takes labetalol 200 mg in the morning and 100 mg in the evening. He also takes the omesartan in the evening as well. He does not know what his bp is after his morning medications. He reports he feels fine. Will forward to the pharm md to help with any med titrations,

## 2021-06-05 ENCOUNTER — Other Ambulatory Visit: Payer: Self-pay

## 2021-06-05 ENCOUNTER — Ambulatory Visit (INDEPENDENT_AMBULATORY_CARE_PROVIDER_SITE_OTHER): Payer: No Typology Code available for payment source | Admitting: Pharmacist

## 2021-06-05 VITALS — BP 144/86 | HR 75 | Resp 18 | Ht 69.5 in | Wt 216.0 lb

## 2021-06-05 DIAGNOSIS — I10 Essential (primary) hypertension: Secondary | ICD-10-CM | POA: Diagnosis not present

## 2021-06-05 MED ORDER — AMLODIPINE BESYLATE 5 MG PO TABS: 5.0000 mg | ORAL_TABLET | Freq: Every day | ORAL | 1 refills | Status: DC

## 2021-06-05 NOTE — Patient Instructions (Addendum)
It was nice meeting you today  We would like your blood pressure to be less than 130/80  Please continue your olmesartan 40mg  daily and your labetalol 200mg  twice a day  We will start a new medication called amlodipine 5 mg once a day  Please continue to monitor your blood pressure at home and let us know your readings either through myChart or over the phone  Please call with any questions  Karren Cobble, PharmD, BCACP, [place], [place] [address] [place], [place] [address] Phone: [phone]; Fax: [phone] 06/05/2021 11:09 AM

## 2021-06-05 NOTE — Progress Notes (Signed)
Patient ID: Shane [name]                 DOB: 02/20/1951                      MRN: [id number]     HPI: Shane [name] is a 70 y.o. male referred by Dr. [name] to HTN clinic. PMH is significant for HTN and HLD.  Patient BP had previously been controlled but recently has increased, especially in morning.  Patient presents today in good spirits.  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  Currently managed on olmesartan and labetalol.  Was started on HCTZ over the Summer but Scr increased to 1.92 and was discontinued.  Creatinine recovered one month later.  Brought BP log.  Has been testing three times daily.  In the morning before he takes his medications, in the midday, and in the evening after nighttime medications.  Takes olmesartan at night.  9/18: 181/98, 127/74 9/17: 186/102, 161/94, 114/74 9/16: 185/105, 166/99, 135/78 9/15: 188/109, 172/97, 121/69  Uses a [facility] upper arm cuff but did not bring with him. Smokes 1-2 cigars per day.  `  Current HTN meds: olmesartan 40mg  daily, labetalol 200mg  BID Previously tried: HCTZ (Scr increase) BP goal: <130/80   Wt Readings from Last 3 Encounters:  06/05/21 216 lb (98 kg)  02/04/21 210 lb (95.3 kg)  12/31/20 211 lb 9.6 oz (96 kg)   BP Readings from Last 3 Encounters:  02/04/21 128/68  12/31/20 (!) 142/86  12/03/20 140/80   Pulse Readings from Last 3 Encounters:  02/04/21 84  12/31/20 69  12/03/20 78    Renal function: CrCl cannot be calculated (Patient's most recent lab result is older than the maximum 21 days allowed.).  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   Nasal fracture    S/P    Prostatic hypertrophy, benign    Tobacco abuse, in remission     cigars x2 a day    Current Outpatient Medications on File Prior to Visit  Medication Sig Dispense Refill   aspirin EC 81 MG tablet Take 81 mg by mouth daily.     atorvastatin (LIPITOR) 40 MG tablet Take 1 tablet (40 mg total) by mouth daily. 90 tablet 3   doxycycline (VIBRAMYCIN) 50 MG capsule Take 50 mg by mouth daily.     hyoscyamine (LEVSIN) 0.125 MG tablet Take 0.125 mg by mouth daily as needed (GI pain).     ketotifen (ZADITOR) 0.025 % ophthalmic solution Place 1 drop into both eyes daily as needed (allergies).     labetalol (NORMODYNE) 100 MG tablet Take 2 tablets (200 mg total) by mouth 2 (two) times daily. 360 tablet 3   loratadine (CLARITIN) 10 MG tablet Take 10 mg by mouth daily as needed for allergies.     MINOXIDIL, TOPICAL, 5 % SOLN Apply 1 application topically daily.     Multiple Vitamin (MULTIVITAMIN) capsule Take 1 capsule by mouth daily.     naproxen sodium (ALEVE) 220 MG tablet Take 220-440 mg by mouth 2 (two) times daily as needed (pain).     olmesartan (BENICAR) 40 MG tablet Take 1  tablet (40 mg total) by mouth daily. 90 tablet 3   omeprazole (PRILOSEC) 40 MG capsule Take 40 mg by mouth daily.     No current facility-administered medications on file prior to visit.    No Known Allergies   Assessment/Plan:  1. Hypertension -  Patient BP in room 144/96 which is above goal of <130/80.  Patient's home BP readings in morning very elevated, however evening readings at or near goal.  Does not seem to be getting around the clock coverage with olmesartan.  Recommended he try to take morning reading 1 hour after medications.  Will add on amlodipine at this time to help.  Patient will call or send over new readings via myChart.  Continue labetalol 200mg  BID Continue olmesartan 40mg  daily Start amlodipine 5mg  daily Recheck as needed  Karren Cobble, PharmD, BCACP, [place], [place] [address] [place], [place] [address] Phone: [phone];  Fax: [phone] 06/05/2021 5:29 PM

## 2021-07-08 MED ORDER — LABETALOL HCL 100 MG PO TABS: ORAL_TABLET | ORAL | 6 refills | Status: DC

## 2021-07-08 NOTE — Telephone Encounter (Signed)
Reviewed home BP readings.    18 AM readings averaged 152/93 (HR 80)  16 PM readings averaged 116/72 (HR 75)  Currently taking   AM:  amlodipine 5 mg, labetalol 200 mg  PM:  olmesartan 40 mg, labetalol 200 mg  Spoke with patient, will have him increase labetalol evening dose to 300 mg, continue all other medications.  Patient voiced understanding

## 2021-08-27 ENCOUNTER — Encounter: Payer: Self-pay | Admitting: Pharmacist Clinician (PhC)/ Clinical Pharmacy Specialist

## 2021-10-15 ENCOUNTER — Encounter: Payer: Self-pay | Admitting: Pharmacist Clinician (PhC)/ Clinical Pharmacy Specialist

## 2021-10-15 DIAGNOSIS — I10 Essential (primary) hypertension: Secondary | ICD-10-CM

## 2021-11-04 LAB — CORTISOL: Cortisol: 9.2 ug/dL

## 2021-11-28 ENCOUNTER — Other Ambulatory Visit: Payer: Self-pay | Admitting: Cardiovascular Disease

## 2021-11-28 DIAGNOSIS — I10 Essential (primary) hypertension: Secondary | ICD-10-CM

## 2021-12-18 ENCOUNTER — Other Ambulatory Visit (HOSPITAL_COMMUNITY): Payer: Self-pay | Admitting: Internal Medicine

## 2021-12-18 ENCOUNTER — Other Ambulatory Visit: Payer: Self-pay | Admitting: Internal Medicine

## 2021-12-18 DIAGNOSIS — R748 Abnormal levels of other serum enzymes: Secondary | ICD-10-CM

## 2021-12-30 ENCOUNTER — Ambulatory Visit (HOSPITAL_COMMUNITY)
Admission: RE | Admit: 2021-12-30 | Discharge: 2021-12-30 | Disposition: A | Discharge status: Home / Self Care | Payer: No Typology Code available for payment source | Source: Ambulatory Visit | Attending: Internal Medicine | Admitting: Internal Medicine

## 2021-12-30 DIAGNOSIS — R748 Abnormal levels of other serum enzymes: Secondary | ICD-10-CM | POA: Insufficient documentation

## 2022-01-07 ENCOUNTER — Encounter: Payer: Self-pay | Admitting: Internal Medicine

## 2022-01-27 ENCOUNTER — Telehealth: Payer: Self-pay

## 2022-01-27 ENCOUNTER — Encounter: Payer: Self-pay | Admitting: Internal Medicine

## 2022-01-27 ENCOUNTER — Ambulatory Visit (INDEPENDENT_AMBULATORY_CARE_PROVIDER_SITE_OTHER): Payer: No Typology Code available for payment source | Admitting: Internal Medicine

## 2022-01-27 ENCOUNTER — Other Ambulatory Visit: Payer: Self-pay

## 2022-01-27 VITALS — BP 130/78 | HR 66 | Temp 97.7°F | Ht 70.0 in | Wt 212.2 lb

## 2022-01-27 DIAGNOSIS — R7989 Other specified abnormal findings of blood chemistry: Secondary | ICD-10-CM

## 2022-01-27 DIAGNOSIS — K838 Other specified diseases of biliary tract: Secondary | ICD-10-CM

## 2022-01-27 NOTE — Progress Notes (Signed)
? ? ?Primary Care Physician:  Redmond School, MD ?Primary Gastroenterologist:  Dr. Gala Romney ? ?Pre-Procedure History & Physical: ?HPI:  OVERTON [name] is a 71 y.o. male here for further evaluation of elevated aminotransferases and a dilated biliary tree.  Accompanying labs reveal an AST and ALT of 51 and 66 respectively from 01/01/2022; alkaline phosphatase 64; total bilirubin 0.6 ; hepatitis B surface antigen negative; hepatitis C antibody negative.  Iron saturation normal at 27% ? ?Ultrasound revealed hepatic steatosis gallbladder in situ and appeared normal no focal hepatic lesions.  Common bile duct slightly dilated to 8 mm. ? ?I note from 8 years ago AST and ALT 42 and 65 both mildly elevated 12 years ago in the [facility] record ;AST and ALT normal at 20 and 21.  Patient has no upper GI tract symptoms.  He denies ever being told previously had hepatitis or yellow jaundice.  His wife died 12 years ago that is when he started drinking bourbon.  He tells me he goes through a large bottle of bourbon weekly has 1 glass full in the evening also consumes 2 glasses of wine.  Denies opioids, herbal remedies.  Has not given blood to the [employer] lately.  Gave blood many many years ago without a rejection letter.  No family history of liver disease. ? ?BMI 30.45. ? ? ? ?Past Medical History:  ?Diagnosis Date  ? Anal fissure   ? Hx of adenomatous colonic polyps   ? per pt report many years ago  ? Hyperlipidemia   ? Hypertension   ? Myocarditis ([employer])   ? Has history of MI x2, but no documentation of same; h/o coronary angiography, but results of study are not available.  ? Nasal fracture   ? S/P   ? Prostatic hypertrophy, benign   ? Tobacco abuse, in remission   ? cigars x2 a day  ? ? ?Past Surgical History:  ?Procedure Laterality Date  ? APPENDECTOMY    ? BUNIONECTOMY    ? COLONOSCOPY  06/30/2006  ? Remote polypectomy; normal rectum/left-side transverse diverticula, pedunculated polyp in left colon,  path with acutely and  chronically inflammed granulation tissue with surface ulceration  ? COLONOSCOPY  09/01/2011  ? Procedure: COLONOSCOPY;  Surgeon: Daneil Dolin, MD;  [facility];  Service: Endoscopy;  Laterality: N/A;  12:30  ? COLONOSCOPY N/A 12/24/2016  ? Dr. Gala Romney: 3 semi-pedunculated polyps measuring 4 to 6 mm in size removed from the ascending colon and cecum.  Diverticulosis.  Pathology revealed tubular adenomas.  Next colonoscopy planned for April 2021.  ? COLONOSCOPY WITH PROPOFOL N/A 07/08/2020  ? Procedure: COLONOSCOPY WITH PROPOFOL;  Surgeon: Daneil Dolin, MD;  Location: [facility];  Service: Endoscopy;  Laterality: N/A;  12:15pm  ? RHINOPLASTY    ? ? ?Prior to Admission medications   ?Medication Sig Start Date End Date Taking? Authorizing Provider  ?amLODipine (NORVASC) 10 MG tablet Take 10 mg by mouth daily.   Yes [provider]  ?aspirin EC 81 MG tablet Take 81 mg by mouth daily.   Yes [provider]  ?atorvastatin (LIPITOR) 40 MG tablet Take 1 tablet (40 mg total) by mouth daily. 12/16/15  Yes BranchAlphonse Guild, MD  ?doxycycline (VIBRAMYCIN) 50 MG capsule Take 50 mg by mouth daily. 02/15/20  Yes [provider]  ?hyoscyamine (LEVSIN) 0.125 MG tablet Take 0.125 mg by mouth daily as needed (GI pain).   Yes [provider]  ?ketotifen (ZADITOR) 0.025 % ophthalmic  solution Place 1 drop into both eyes daily as needed (allergies).   Yes [provider]  ?labetalol (NORMODYNE) 100 MG tablet Take 2 tablets (200 mg) each morning and 3 tablets (300 mg) each evening. 07/08/21  Yes Lorretta Harp, MD  ?loratadine (CLARITIN) 10 MG tablet Take 10 mg by mouth daily as needed for allergies.   Yes [provider]  ?MINOXIDIL, TOPICAL, 5 % SOLN Apply 1 application topically daily.   Yes [provider]  ?Multiple Vitamin (MULTIVITAMIN) capsule Take 1 capsule by mouth daily.   Yes [provider]  ?naproxen sodium (ALEVE) 220 MG tablet Take  220-440 mg by mouth 2 (two) times daily as needed (pain).   Yes [provider]  ?olmesartan (BENICAR) 40 MG tablet Take 1 tablet (40 mg total) by mouth daily. 05/15/21  Yes Lorretta Harp, MD  ?omeprazole (PRILOSEC) 40 MG capsule Take 40 mg by mouth daily.   Yes [provider]  ?tamsulosin (FLOMAX) 0.4 MG CAPS capsule 0.4 mg daily. 01/04/22  Yes [provider]  ? ? ?Allergies as of 01/27/2022  ? (No Known Allergies)  ? ? ?Family History  ?Problem Relation Age of Onset  ? Heart attack Father   ? Colon cancer Neg Hx   ? ? ?Social History  ? ?Socioeconomic History  ? Marital status: Widowed  ?  Spouse name: Not on file  ? Number of children: Not on file  ? Years of education: Not on file  ? Highest education level: Not on file  ?Occupational History  ? Occupation: retired  ?  Employer: [employer]  ?  Comment: post office  ?Tobacco Use  ? Smoking status: Every Day  ?  Types: Cigars  ? Smokeless tobacco: Never  ? Tobacco comments:  ?  2 cigars daily  ?Substance and Sexual Activity  ? Alcohol use: Yes  ?  Alcohol/week: 14.0 standard drinks  ?  Types: 7 Glasses of wine, 7 Shots of liquor per week  ?  Comment: 2 glasses of wine every night and glass of bourbon  ? Drug use: No  ? Sexual activity: Yes  ?Other Topics Concern  ? Not on file  ?Social History Narrative  ? Not on file  ? ?Social Determinants of Health  ? ?Financial Resource Strain: Not on file  ?Food Insecurity: Not on file  ?Transportation Needs: Not on file  ?Physical Activity: Not on file  ?Stress: Not on file  ?Social Connections: Not on file  ?Intimate Partner Violence: Not on file  ? ? ?Review of Systems: ?See HPI, otherwise negative ROS ? ?Physical Exam: ?BP 130/78 (BP Location: Right Arm, Patient Position: Sitting, Cuff Size: Normal)   Pulse 66   Temp 97.7 ?F (36.5 ?C) (Temporal)   Ht '5\' 10"'$  (1.778 m)   Wt 212 lb 3.2 oz (96.3 kg)   SpO2 97%   BMI 30.45 kg/m?  ?General:   Alert,   pleasant and cooperative in  NAD ?Eyes:  Sclera clear, no icterus.   Conjunctiva pink. ?Ears:  Normal auditory acuity. ?Neck:  Supple; no masses or thyromegaly. No significant cervical adenopathy. ?Lungs:  Clear throughout to auscultation.   No wheezes, crackles, or rhonchi. No acute distress. ?Heart:  Regular rate and rhythm; no murmurs, clicks, rubs,  or gallops. ?Abdomen: Obese .  Positive bowel sounds soft nontender no appreciable mass or hepatosplenomegaly. ?Pulses:  Normal pulses noted. ?Extremities:  Without clubbing or edema. ? ?Impression/Plan: Very pleasant 71 year old gentleman with obesity  and daily alcohol consumption referred for nonspecifically elevated transaminases, fatty appearing liver on ultrasound and a mildly dilated bile duct of 8 mm. ?Bile duct diameter borderline, allowing 1 mm of diameter per decade of life.  He certainly does not have any obstructing symptoms. ? ?More likely the etiology of his elevated transaminases is steatohepatitis due to a combination of obesity and daily alcohol consumption. ? ?Discussed the entity of fatty liver at some length with Shane [name] today.  Clinically, he does not have advanced chronic liver disease.  Viral markers negative, iron levels not elevated. ? ?Recommendations: ? ? ?Information on fatty liver provided ? ?As discussed, your elevated liver enzymes may be related to your daily alcohol consumption. ? ?As a trial, recommend cutting out all forms of alcohol, wine, bourbon, beer x2 months; we will repeat your liver blood work around July 16 and see if they improve. ? ?It is less likely your slightly dilated bile duct is a problem; however, I do recommend we get the special MRI/MRCP of your bile duct in the near future. ? ?We will have a prescription for Xanax 0.5 mg tablet (take (1) 30 minutes before MRI) to help with claustrophobic symptoms (dispense 1 with no refills) ? ?Would like to see you lose 10 to 15 pounds in the next 6 months ? ?Regular aerobic exercise 30 to 45 minutes  3 times weekly will help your lipid liver and overall health ? ?Office visit here in 3 months ? ?Plan for repeat colonoscopy in 4 years. ? ? ? ?Notice: This dictation was prepared with Dragon dictation along with smal

## 2022-01-27 NOTE — Patient Instructions (Signed)
It was good seeing you again today! ? ?Information on fatty liver provided ? ?As discussed, your elevated liver enzymes may be related to your daily alcohol consumption. ? ?As a trial, recommend cutting out all forms of alcohol, wine, bourbon, beer x2 months; we will repeat your liver blood work around July 16 and see if they improve. ? ?It is less likely your slightly dilated bile duct is a problem; however, I do recommend we get the special MRI/MRCP of your bile duct in the near future. ? ?We will have a prescription for Xanax 0.5 mg tablet (take (1) 30 minutes before MRI) to help with claustrophobic symptoms (dispense 1 with no refills) ? ?Would like to see you lose 10 to 15 pounds in the next 6 months ? ?Regular aerobic exercise 30 to 45 minutes 3 times weekly will help your lipid liver and overall health ? ?Office visit here in 3 months ? ?Plan for repeat colonoscopy in 4 years. ? ? ?

## 2022-01-27 NOTE — Telephone Encounter (Signed)
No PA needed for MRI/MRCP per [facility] website: The member is not in scope for prior-authorization/notification for the services requested. If you feel this is in error or have questions around what services require prior-authorization/notification, please call the number on the back of the member's ID card. ?  ? ?

## 2022-01-27 NOTE — Telephone Encounter (Signed)
MRI/MRCP abd w/wo contrast scheduled for 02/13/22 at 12:00pm, arrive at 11:30am. NPO 4 hours prior to test. ? ?Tried to call pt, LMOVM to inform him MRI has been scheduled and he can see appt on MyChart. Appt letter mailed. ?

## 2022-01-28 ENCOUNTER — Telehealth: Payer: Self-pay

## 2022-01-28 ENCOUNTER — Other Ambulatory Visit: Payer: Self-pay

## 2022-01-28 NOTE — Telephone Encounter (Signed)
-----   Message from Daneil Dolin, MD sent at 01/28/2022  1:29 PM EDT ----- ?Please let know he should take the Xanax capsule whole 1 hour before his procedure.  He should have someone drive him to the x-ray and back home.  I believe taking Xanax 1 hour before the x-ray will be more effective for him.  Thanks.   ? ?

## 2022-01-28 NOTE — Telephone Encounter (Signed)
Pt was made aware and stated that someone would be driving him. Due to it being a controlled substance I was unable to send the Xanax in to the pharmacy. You will need to send that in to [facility] on [address]  ?

## 2022-01-29 MED ORDER — ALPRAZOLAM 0.5 MG PO TABS: 0.5000 mg | ORAL_TABLET | Freq: Once | ORAL | 0 refills | Status: AC

## 2022-01-29 NOTE — Telephone Encounter (Signed)
Pt was made aware. RX up front for pt to pick up.

## 2022-01-29 NOTE — Telephone Encounter (Signed)
I completed prescription and printed. Patient will need to pick up.

## 2022-01-29 NOTE — Addendum Note (Signed)
Addended by: Annitta Needs on: 01/29/2022 10:52 AM   Modules accepted: Orders

## 2022-02-11 ENCOUNTER — Telehealth: Payer: Self-pay

## 2022-02-11 NOTE — Telephone Encounter (Signed)
Tonya at [facility]service [facility] called. Pt needs PA for MRI/MRCP via [employer].   PA for MRI/MRCP submitted via [place] website. PA# [id number], valid 02/11/22-05/12/22.  Called Tonya [phone], ext [phone]), informed her of PA.

## 2022-02-13 ENCOUNTER — Ambulatory Visit (HOSPITAL_COMMUNITY)
Admission: RE | Admit: 2022-02-13 | Discharge: 2022-02-13 | Disposition: A | Discharge status: Home / Self Care | Payer: No Typology Code available for payment source | Source: Ambulatory Visit | Attending: Internal Medicine | Admitting: Internal Medicine

## 2022-02-13 ENCOUNTER — Other Ambulatory Visit: Payer: Self-pay | Admitting: Internal Medicine

## 2022-02-13 DIAGNOSIS — K838 Other specified diseases of biliary tract: Secondary | ICD-10-CM | POA: Insufficient documentation

## 2022-02-13 DIAGNOSIS — R7989 Other specified abnormal findings of blood chemistry: Secondary | ICD-10-CM | POA: Insufficient documentation

## 2022-02-13 MED ORDER — GADOBUTROL 1 MMOL/ML IV SOLN
10.0000 mL | Freq: Once | INTRAVENOUS | Status: AC | PRN
Start: 2022-02-13 — End: 2022-02-13
  Administered 2022-02-13: 10 mL via INTRAVENOUS

## 2022-03-11 ENCOUNTER — Ambulatory Visit: Payer: No Typology Code available for payment source | Admitting: Physician Assistant

## 2022-03-11 NOTE — Progress Notes (Signed)
Cardiology Clinic Note   Patient Name: Shane [name] Date of Encounter: 03/13/2022  Primary Care Provider:  Redmond School, MD Primary Cardiologist:   Shane Harp MD Shane [name]    Patient Profile    71 year old male with hypertension, hyperlipidemia, history of EtOH abuse, with renal artery Doppler study normal in 2021.  Also being followed by Dr. Gala [name] in [place] in the setting of aminotransferases and a dilated biliary tree.  He was diagnosed with hepatic steatosis. He was last seen by Dr. Gwenlyn [name] on 02/04/2021.  At that office visit the patient's blood pressure was controlled on HCTZ, labetalol, and Benicar  HCTZ was decreased in dose from 25 mg daily to 12.5 mg daily due to elevated creatinine of 1.4.  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   Nasal fracture    S/P    Prostatic hypertrophy, benign    Tobacco abuse, in remission    cigars x2 a day   Past Surgical History:  Procedure Laterality Date   APPENDECTOMY     BUNIONECTOMY     COLONOSCOPY  06/30/2006   Remote polypectomy; normal rectum/left-side transverse diverticula, pedunculated polyp in left colon,  path with acutely and chronically inflammed granulation tissue with surface ulceration   COLONOSCOPY  09/01/2011   Procedure: COLONOSCOPY;  Surgeon: Shane Dolin, MD;  [facility]: [facility];  Service: Endoscopy;  Laterality: N/A;  12:30   COLONOSCOPY N/A 12/24/2016   Dr. Gala [name]: 3 semi-pedunculated polyps measuring 4 to 6 mm in size removed from the ascending colon and cecum.  Diverticulosis.  Pathology revealed tubular adenomas.  Next colonoscopy planned for April 2021.   COLONOSCOPY WITH PROPOFOL N/A 07/08/2020    Procedure: COLONOSCOPY WITH PROPOFOL;  Surgeon: Shane Dolin, MD;  Location: [facility];  Service: Endoscopy;  Laterality: N/A;  12:15pm   RHINOPLASTY      Allergies  No Known Allergies  History of Present Illness    [name] is a very pleasant 71 year old male we are seeing for annual follow-up with known history of hypertension, hyperlipidemia, prior history of EtOH abuse.  He comes today without any new complaints.  He has recently had a tooth extraction on 03/12/2022 which went well.  The patient is due to have a left great toe hammertoe repair September 2023 with Shane [name].  He has not yet planned this nor has he requested a cardiology preoperative evaluation for this.  He is awaiting a future appointment to discuss this further with Shane [name] at which time they will reach out to [place] if necessary.  He denies any chest discomfort, dyspnea on exertion, dizziness, near syncope, PND or orthopnea.  He owns a good bit of land and is very active on this property.  He is continued to be followed by Dr. Gala [name] for aminotransferases and dilated biliary tree.  Ongoing surveillance.  Home Medications    Current Outpatient Medications  Medication Sig Dispense Refill   amLODipine (NORVASC) 10 MG tablet Take 10 mg by mouth daily.     aspirin EC 81 MG tablet Take 81 mg by mouth daily.     atorvastatin (  LIPITOR) 40 MG tablet Take 1 tablet (40 mg total) by mouth daily. 90 tablet 3   doxycycline (VIBRAMYCIN) 50 MG capsule Take 50 mg by mouth daily.     hyoscyamine (LEVSIN) 0.125 MG tablet Take 0.125 mg by mouth daily as needed (GI pain).     ketotifen (ZADITOR) 0.025 % ophthalmic solution Place 1 drop into both eyes daily as needed (allergies).     loratadine (CLARITIN) 10 MG tablet Take 10 mg by mouth daily as needed for allergies.     MINOXIDIL, TOPICAL, 5 % SOLN Apply 1 application topically daily.     Multiple Vitamin (MULTIVITAMIN) capsule Take 1 capsule by mouth daily.     naproxen  sodium (ALEVE) 220 MG tablet Take 220-440 mg by mouth 2 (two) times daily as needed (pain).     olmesartan (BENICAR) 40 MG tablet Take 1 tablet (40 mg total) by mouth daily. 90 tablet 3   omeprazole (PRILOSEC) 40 MG capsule Take 40 mg by mouth daily.     tamsulosin (FLOMAX) 0.4 MG CAPS capsule 0.4 mg daily.     labetalol (NORMODYNE) 100 MG tablet Take 2 tablets (200 mg) each morning and 3 tablets (300 mg) each evening. 450 tablet 3   No current facility-administered medications for this visit.     Family History    Family History  Problem Relation Age of Onset   Heart attack Father    Colon cancer Neg Hx    He indicated that the status of his father is unknown. He indicated that the status of his neg hx is unknown.  Social History    Social History   Socioeconomic History   Marital status: Widowed    Spouse name: Not on file   Number of children: Not on file   Years of education: Not on file   Highest education level: Not on file  Occupational History   Occupation: retired    Fish farm manager: [employer]    Comment: post office  Tobacco Use   Smoking status: Every Day    Types: Cigars   Smokeless tobacco: Never   Tobacco comments:    2 cigars daily  Substance and Sexual Activity   Alcohol use: Yes    Alcohol/week: 14.0 standard drinks of alcohol    Types: 7 Glasses of wine, 7 Shots of liquor per week    Comment: 2 glasses of wine every night and glass of bourbon   Drug use: No   Sexual activity: Yes  Other Topics Concern   Not on file  Social History Narrative   Not on file   Social Determinants of Health   Financial Resource Strain: Not on file  Food Insecurity: Not on file  Transportation Needs: Not on file  Physical Activity: Not on file  Stress: Not on file  Social Connections: Not on file  Intimate Partner Violence: Not on file     Review of Systems    General:  No chills, fever, night sweats or weight changes.  Cardiovascular:  No chest pain,  dyspnea on exertion, edema, orthopnea, palpitations, paroxysmal nocturnal dyspnea. Dermatological: No rash, lesions/masses Respiratory: No cough, dyspnea Urologic: No hematuria, dysuria Abdominal:   No nausea, vomiting, diarrhea, bright red blood per rectum, melena, or hematemesis Neurologic:  No visual changes, wkns, changes in mental status. All other systems reviewed and are otherwise negative except as noted above.     Physical Exam    VS:  BP 120/70 (BP Location: Left Arm)  Pulse (!) 54   Ht 5' 9.5" (1.765 m)   Wt 202 lb 6.4 oz (91.8 kg)   SpO2 98%   BMI 29.46 kg/m  , BMI Body mass index is 29.46 kg/m.     GEN: Well nourished, well developed, in no acute distress. HEENT: normal. Neck: Supple, no JVD, carotid bruits, or masses. Cardiac: RRR, no murmurs, rubs, or gallops. No clubbing, cyanosis, edema.  Radials/DP/PT 2+ and equal bilaterally.  Respiratory:  Respirations regular and unlabored, clear to auscultation bilaterally. GI: Soft, nontender, nondistended, BS + x 4. MS: no deformity or atrophy. Skin: warm and dry, no rash. Neuro:  Strength and sensation are intact. Psych: Normal affect.  Accessory Clinical Findings    ECG personally reviewed by me today-sinus bradycardia, heart rate of 54 bpm, evidence of prior inferior/anterior infarct No acute changes compared to EKG on 11/05/2020.  Lab Results  Component Value Date   WBC 4.1 10/10/2013   HGB 13.0 10/10/2013   HCT 38.2 (L) 10/10/2013   MCV 91.8 10/10/2013   PLT 180 10/10/2013   Lab Results  Component Value Date   CREATININE 1.08 03/26/2021   BUN 14 03/26/2021   NA 140 03/26/2021   K 4.1 03/26/2021   CL 104 03/26/2021   CO2 22 03/26/2021   Lab Results  Component Value Date   ALT 65 (H) 10/10/2013   AST 42 (H) 10/10/2013   ALKPHOS 52 10/10/2013   BILITOT 0.6 10/10/2013   Lab Results  Component Value Date   CHOL 163 08/26/2012   HDL 59 08/26/2012   LDLCALC 87 08/26/2012   TRIG 85 08/26/2012    CHOLHDL 2.8 08/26/2012    No results [name] for: "HGBA1C"  Review of Prior Studies:   Assessment & Plan   1.  Hypertension: Excellent control of blood pressure on current medication regimen.  He has had some issues concerning his labetalol prescription as the prescription Sig was incorrect therefore we have updated this prescription.  He will now be on labetalol 200 mg in the morning and 300 mg in the evening as prescribed originally.  (The instructions gave him 200 mg twice daily and this was incorrect).  Heart rate I  2.  Hyperlipidemia: He continues on atorvastatin 40 mg daily.  Primary care is following this along with Dr. Gala [name] in the setting of aminotransferases and dilated biliary tree.  ALT and AST are being done serially through GI.  3.  EtOH abuse: He has cut down significantly to 1 or 2 drinks a week only.  He states that he mostly drinks Coke 0 and coffee.  He is congratulated on this endeavor and he is encouraged to continue to reduce his alcohol intake.  4.  Planned hammertoe surgery: He is stable from a cardiac standpoint and can hold aspirin 5 days prior to surgery at the discretion of the surgeon.  Current medicines are reviewed at length with the patient today.  I have spent 30 min's  dedicated to the care of this patient on the date of this encounter to include pre-visit review of records, assessment, management and diagnostic testing,with shared decision making. Signed, Phill Myron. West Pugh, ANP, AACC   03/13/2022 10:07 AM    [facility] [place] [address] Office [phone] Fax [phone]  Notice: This dictation was prepared with Dragon dictation along with smaller phrase technology. Any transcriptional errors that result from this process are unintentional and may not be corrected upon review.

## 2022-03-13 ENCOUNTER — Ambulatory Visit (INDEPENDENT_AMBULATORY_CARE_PROVIDER_SITE_OTHER): Payer: No Typology Code available for payment source | Admitting: Adult Health

## 2022-03-13 ENCOUNTER — Encounter: Payer: Self-pay | Admitting: Adult Health

## 2022-03-13 VITALS — BP 120/70 | HR 54 | Ht 69.5 in | Wt 202.4 lb

## 2022-03-13 DIAGNOSIS — E7800 Pure hypercholesterolemia, unspecified: Secondary | ICD-10-CM

## 2022-03-13 DIAGNOSIS — F17201 Nicotine dependence, unspecified, in remission: Secondary | ICD-10-CM | POA: Diagnosis not present

## 2022-03-13 DIAGNOSIS — I10 Essential (primary) hypertension: Secondary | ICD-10-CM | POA: Diagnosis not present

## 2022-03-13 DIAGNOSIS — I401 Isolated myocarditis: Secondary | ICD-10-CM

## 2022-03-13 MED ORDER — LABETALOL HCL 100 MG PO TABS: ORAL_TABLET | ORAL | 3 refills | Status: DC

## 2022-03-13 NOTE — Patient Instructions (Signed)
Medication Instructions:  No Changes *If you need a refill on your cardiac medications before your next appointment, please call your pharmacy*   Lab Work: No Labs If you have labs (blood work) drawn today and your tests are completely normal, you will receive your results only by: [place] (if you have MyChart) OR A paper copy in the mail If you have any lab test that is abnormal or we need to change your treatment, we will call you to review the results.   Testing/Procedures: No Testing   Follow-Up: At [facility], you and your health needs are our priority.  As part of our continuing mission to provide you with exceptional heart care, we have created designated Provider Care Teams.  These Care Teams include your primary Cardiologist (physician) and Advanced Practice Providers (APPs -  Physician Assistants and Nurse Practitioners) who all work together to provide you with the care you need, when you need it.  We recommend signing up for the patient portal called "MyChart".  Sign up information is provided on this After Visit Summary.  MyChart is used to connect with patients for Virtual Visits (Telemedicine).  Patients are able to view lab/test results, encounter notes, upcoming appointments, etc.  Non-urgent messages can be sent to your provider as well.   To learn more about what you can do with MyChart, go to [email or web address].    Your next appointment:   1 year(s)  The format for your next appointment:   In Person  Provider:   Quay Burow, MD      Important Information About Sugar

## 2022-03-18 ENCOUNTER — Other Ambulatory Visit: Payer: Self-pay

## 2022-03-18 DIAGNOSIS — R7989 Other specified abnormal findings of blood chemistry: Secondary | ICD-10-CM

## 2022-03-31 LAB — HEPATIC FUNCTION PANEL
ALT: 18 IU/L (ref 0–44)
AST: 12 IU/L (ref 0–40)
Albumin: 4.2 g/dL (ref 3.8–4.8)
Alkaline Phosphatase: 62 IU/L (ref 44–121)
Bilirubin Total: 0.4 mg/dL (ref 0.0–1.2)
Bilirubin, Direct: 0.14 mg/dL (ref 0.00–0.40)
Total Protein: 6.5 g/dL (ref 6.0–8.5)

## 2022-04-01 ENCOUNTER — Other Ambulatory Visit: Payer: Self-pay

## 2022-04-01 DIAGNOSIS — R7989 Other specified abnormal findings of blood chemistry: Secondary | ICD-10-CM

## 2022-05-03 NOTE — Progress Notes (Addendum)
GI Office Note    Referring Provider: Redmond School, MD Primary Care Physician:  Redmond School, MD  Primary Gastroenterologist: Garfield Cornea, MD   Chief Complaint   Chief Complaint  Patient presents with   Follow-up    Doing well.     History of Present Illness   Shane [name] is a 71 y.o. male presenting today for follow up of elevated LFTs. Last seen in 01/2022. History of mildly elevated AST/ALT with negative Hep B surface Ag and Hep C Ab. Iron sat 27%. U/S with hepatic steatosis, CBD 28m. H/O significant etoh daily.  At last office visit he admitted to drinking a large bottle of bourbon weekly, as 1 full glass the evening and also consumes 2 glasses of wine.  He was advised to cut out all etoh at last OV. LFTs repeated 03/30/22 and were normal. Suspected that LFTs abnormality previously due to etoh use given normalization in numbers after withholding etoh. MRCP 02/2022 showing mild hepatic steatosis, no intra or extrahepatic biliary ductal dilatation. CBD 316m  Patient states he started drinking after his liver numbers normalized.  He states however he is drinking about half of what he used to.  He plans to cut back even further.  Has upcoming hammertoe surgery next month.  Denies any bowel concerns.  Has been on omeprazole 40 mg daily for years, no prior endoscopy.  States its been offered but he has declined.  Denies any heartburn or dysphagia.  Wonders if he needs omeprazole regularly.  No melena or rectal bleeding.  Colonoscopy 06/2020: - Diverticulosis in the entire examined colon. - The examination was otherwise normal on direct and retroflexion views. - No specimens collected. -next colonoscopy in 2026.    Medications   Current Outpatient Medications  Medication Sig Dispense Refill   amLODipine (NORVASC) 10 MG tablet Take 10 mg by mouth daily.     aspirin EC 81 MG tablet Take 81 mg by mouth daily.     atorvastatin (LIPITOR) 40 MG tablet Take 1 tablet  (40 mg total) by mouth daily. 90 tablet 3   doxycycline (VIBRAMYCIN) 50 MG capsule Take 50 mg by mouth daily.     hyoscyamine (LEVSIN) 0.125 MG tablet Take 0.125 mg by mouth daily as needed (GI pain).     ketotifen (ZADITOR) 0.025 % ophthalmic solution Place 1 drop into both eyes daily as needed (allergies).     labetalol (NORMODYNE) 100 MG tablet Take 2 tablets (200 mg) each morning and 3 tablets (300 mg) each evening. 450 tablet 3   loratadine (CLARITIN) 10 MG tablet Take 10 mg by mouth daily as needed for allergies.     MINOXIDIL, TOPICAL, 5 % SOLN Apply 1 application topically daily.     Multiple Vitamin (MULTIVITAMIN) capsule Take 1 capsule by mouth daily.     naproxen sodium (ALEVE) 220 MG tablet Take 220-440 mg by mouth 2 (two) times daily as needed (pain).     olmesartan (BENICAR) 40 MG tablet Take 1 tablet (40 mg total) by mouth daily. 90 tablet 3   omeprazole (PRILOSEC) 40 MG capsule Take 40 mg by mouth daily.     tamsulosin (FLOMAX) 0.4 MG CAPS capsule 0.4 mg daily.     No current facility-administered medications for this visit.    Allergies   Allergies as of 05/04/2022   (No Known Allergies)       Review of Systems   General: Negative for anorexia, weight loss, fever, chills, fatigue, weakness.  ENT: Negative for hoarseness, difficulty swallowing , nasal congestion. CV: Negative for chest pain, angina, palpitations, dyspnea on exertion, peripheral edema.  Respiratory: Negative for dyspnea at rest, dyspnea on exertion, cough, sputum, wheezing.  GI: See history of present illness. GU:  Negative for dysuria, hematuria, urinary incontinence, urinary frequency, nocturnal urination.  Endo: Negative for unusual weight change.     Physical Exam   BP 128/75 (BP Location: Left Arm, Patient Position: Sitting, Cuff Size: Normal)   Pulse 68   Temp (!) 97.5 F (36.4 C) (Temporal)   Ht '5\' 10"'$  (1.778 m)   Wt 209 lb 3.2 oz (94.9 kg)   SpO2 96%   BMI 30.02 kg/m    General:  Well-nourished, well-developed in no acute distress.  Eyes: No icterus. Mouth: Oropharyngeal mucosa moist and pink , no lesions erythema or exudate. Abdomen: Bowel sounds are normal, nontender, nondistended, no hepatosplenomegaly or masses,  no abdominal bruits or hernia , no rebound or guarding.  Rectal: not performed  Extremities: trace bilateral lower extremity edema. No clubbing or deformities. Neuro: Alert and oriented x 4   Skin: Warm and dry, no jaundice.   Psych: Alert and cooperative, normal mood and affect.  Labs   Lab Results  Component Value Date   ALT 18 03/30/2022   AST 12 03/30/2022   ALKPHOS 62 03/30/2022   BILITOT 0.4 03/30/2022    Imaging Studies   No results found.  Assessment   Abnormal LFTs: LFTs normalized after cessation of alcohol use.  Fatty liver noted on imaging.  No evidence of advanced liver disease at this point.  Patient has resumed alcohol but at half volume of what he used to drink.  Encouraged him to cut back further and limit as possible.  Increase physical activity as tolerated.  Reduce weight.  We will continue to monitor labs, due again around December with follow-up visit after that.  GERD: Patient states he has been on omeprazole for years, no prior EGD.  Patient previously declined.  Not interested at this time.  He would like to see if he still needs medication.  Encouraged him to try every other day dosing at first to see if he tolerates it.   PLAN   Cut back on omeprazole to 40 mg every other day if tolerated.  If recurrent symptoms, resume daily dosing. Cut out alcohol as much as feasible. Repeat LFTs planned around December with office visit to follow.   Laureen Ochs. Bobby Rumpf, [place], [facility]

## 2022-05-04 ENCOUNTER — Ambulatory Visit (INDEPENDENT_AMBULATORY_CARE_PROVIDER_SITE_OTHER): Payer: No Typology Code available for payment source | Admitting: Gastroenterology

## 2022-05-04 ENCOUNTER — Encounter: Payer: Self-pay | Admitting: Gastroenterology

## 2022-05-04 VITALS — BP 128/75 | HR 68 | Temp 97.5°F | Ht 70.0 in | Wt 209.2 lb

## 2022-05-04 DIAGNOSIS — K760 Fatty (change of) liver, not elsewhere classified: Secondary | ICD-10-CM | POA: Diagnosis not present

## 2022-05-04 DIAGNOSIS — K219 Gastro-esophageal reflux disease without esophagitis: Secondary | ICD-10-CM | POA: Diagnosis not present

## 2022-05-04 NOTE — Patient Instructions (Addendum)
Consider cutting back on omeprazole to every other day if tolerated. If you have recurrent heartburn, indigestion, or abdominal pain, please resume every day dosing. You have fatty liver which can be caused by alcohol consumption, obesity. Please try to eliminate alcohol as much as possible. Try to reduce your weight. Be as physically active as you can.  We will send reminder in for labs around December. Plan for return office visit after that.   Fatty Liver Disease  The liver converts food into energy, removes toxic material from the blood, makes important proteins, and absorbs necessary vitamins from food. Fatty liver disease occurs when too much fat has built up in your liver cells. Fatty liver disease is also called hepatic steatosis. In many cases, fatty liver disease does not cause symptoms or problems. It is often diagnosed when tests are being done for other reasons. However, over time, fatty liver can cause inflammation that may lead to more serious liver problems, such as scarring of the liver (cirrhosis) and liver failure. Fatty liver is associated with insulin resistance, increased body fat, high blood pressure (hypertension), and high cholesterol. These are features of metabolic syndrome and increase your risk for stroke, diabetes, and heart disease. What are the causes? This condition may be caused by components of metabolic syndrome: Obesity. Insulin resistance. High cholesterol. Other causes: Alcohol abuse. Poor nutrition. Cushing syndrome. Pregnancy. Certain drugs. Poisons. Some viral infections. What increases the risk? You are more likely to develop this condition if you: Abuse alcohol. Are overweight. Have diabetes. Have hepatitis. Have a high triglyceride level. Are pregnant. What are the signs or symptoms? Fatty liver disease often does not cause symptoms. If symptoms do develop, they can include: Fatigue and weakness. Weight loss. Confusion. Nausea, vomiting,  or abdominal pain. Yellowing of your skin and the white parts of your eyes (jaundice). Itchy skin. How is this diagnosed? This condition may be diagnosed by: A physical exam and your medical history. Blood tests. Imaging tests, such as an ultrasound, CT scan, or MRI. A liver biopsy. A small sample of liver tissue is removed using a needle. The sample is then looked at under a microscope. How is this treated? Fatty liver disease is often caused by other health conditions. Treatment for fatty liver may involve medicines and lifestyle changes to manage conditions such as: Alcoholism. High cholesterol. Diabetes. Being overweight or obese. Follow these instructions at home:  Do not drink alcohol. If you have trouble quitting, ask your health care provider how to safely quit with the help of medicine or a supervised program. This is important to keep your condition from getting worse. Eat a healthy diet as told by your health care provider. Ask your health care provider about working with a dietitian to develop an eating plan. Exercise regularly. This can help you lose weight and control your cholesterol and diabetes. Talk to your health care provider about an exercise plan and which activities are best for you. Take over-the-counter and prescription medicines only as told by your health care provider. Keep all follow-up visits. This is important. Contact a health care provider if: You have trouble controlling your: Blood sugar. This is especially important if you have diabetes. Cholesterol. Drinking of alcohol. Get help right away if: You have abdominal pain. You have jaundice. You have nausea and are vomiting. You vomit blood or material that looks like coffee grounds. You have stools that are black, tar-like, or bloody. Summary Fatty liver disease develops when too much fat builds up  in the cells of your liver. Fatty liver disease often causes no symptoms or problems. However, over  time, fatty liver can cause inflammation that may lead to more serious liver problems, such as scarring of the liver (cirrhosis). You are more likely to develop this condition if you abuse alcohol, are pregnant, are overweight, have diabetes, have hepatitis, or have high triglyceride or cholesterol levels. Contact your health care provider if you have trouble controlling your blood sugar, cholesterol, or drinking of alcohol. This information is not intended to replace advice given to you by your health care provider. Make sure you discuss any questions you have with your health care provider. Document Revised: 06/13/2020 Document Reviewed: 06/13/2020 Elsevier Patient Education  [place].

## 2022-05-29 ENCOUNTER — Telehealth: Payer: Self-pay | Admitting: *Deleted

## 2022-05-29 NOTE — Telephone Encounter (Signed)
   Pre-operative Risk Assessment    Patient Name: ARLEN DUPUIS  DOB: 1950/12/08 MRN: [id number]      Request for Surgical Clearance    Procedure:   CLOSING BASE WEDGE OSTEOTOMY OF THE FIRST METATARSAL, LEFT FOOT; POSSIBLE DISTAL FIRST METATARSAL OSTEOTOMY WITH REMOVAL OF PREVIOUS HARDWARE, LEFT FOOT; HAMMERTOE REPAIR OF THE SECOND TOE, LEFT FOOT; WEIL OSTEOTOMY OF THE SECOND  METATARSAL, LEFT FOOT  Date of Surgery:  Clearance 06/19/22                                 Surgeon:  DR. IVAN McKINNEY Surgeon's Group or Practice Name:  [facility] Phone number:  [phone] Fax number:  [phone]   Type of Clearance Requested:   - Medical ; ASA    Type of Anesthesia:  General    Additional requests/questions:    Jiles Prows   05/29/2022, 4:04 PM

## 2022-06-01 NOTE — Telephone Encounter (Signed)
   Name: Shane [name]  DOB: 05/11/1951  MRN: [id number]  Primary Cardiologist: None  Chart reviewed as part of pre-operative protocol coverage. Because of [name]'s past medical history and time since last visit, he will require a follow-up telephone visit in order to better assess preoperative cardiovascular risk.  Pre-op covering staff: - Please schedule appointment and call patient to inform them. If patient already had an upcoming appointment within acceptable timeframe, please add "pre-op clearance" to the appointment notes so provider is aware. - Please contact requesting surgeon's office via preferred method (i.e, phone, fax) to inform them of need for appointment prior to surgery.  Previous clearance for a hammertoe surgery with a 5-day aspirin hold.  He can hold his aspirin up to 7 days prior to the procedure.  Please resume when medically safe to do so.  Elgie Collard, PA-C  06/01/2022, 1:04 PM

## 2022-06-02 ENCOUNTER — Telehealth: Payer: Self-pay | Admitting: *Deleted

## 2022-06-02 NOTE — Telephone Encounter (Signed)
Pt agreeable to tele pre op appt 06/09/22 @ 2:20. Med rec and consent are done.

## 2022-06-02 NOTE — Telephone Encounter (Signed)
Pt agreeable to tele pre op appt 06/09/22 @ 2:20. Med rec and consent are done.      Patient Consent for Virtual Visit        Shane [name] has provided verbal consent on 06/02/2022 for a virtual visit (video or telephone).   CONSENT FOR VIRTUAL VISIT FOR:  Shane [name]  By participating in this virtual visit I agree to the following:  I hereby voluntarily request, consent and authorize [facility] and its employed or contracted physicians, physician assistants, nurse practitioners or other licensed health care professionals (the Practitioner), to provide me with telemedicine health care services (the "Services") as deemed necessary by the treating Practitioner. I acknowledge and consent to receive the Services by the Practitioner via telemedicine. I understand that the telemedicine visit will involve communicating with the Practitioner through live audiovisual communication technology and the disclosure of certain medical information by electronic transmission. I acknowledge that I have been given the opportunity to request an in-person assessment or other available alternative prior to the telemedicine visit and am voluntarily participating in the telemedicine visit.  I understand that I have the right to withhold or withdraw my consent to the use of telemedicine in the course of my care at any time, without affecting my right to future care or treatment, and that the Practitioner or I may terminate the telemedicine visit at any time. I understand that I have the right to inspect all information obtained and/or recorded in the course of the telemedicine visit and may receive copies of available information for a reasonable fee.  I understand that some of the potential risks of receiving the Services via telemedicine include:  Delay or interruption in medical evaluation due to technological equipment failure or disruption; Information transmitted may not be sufficient (e.g.  poor resolution of images) to allow for appropriate medical decision making by the Practitioner; and/or  In rare instances, security protocols could fail, causing a breach of personal health information.  Furthermore, I acknowledge that it is my responsibility to provide information about my medical history, conditions and care that is complete and accurate to the best of my ability. I acknowledge that Practitioner's advice, recommendations, and/or decision may be based on factors not within their control, such as incomplete or inaccurate data provided by me or distortions of diagnostic images or specimens that may result from electronic transmissions. I understand that the practice of medicine is not an exact science and that Practitioner makes no warranties or guarantees regarding treatment outcomes. I acknowledge that a copy of this consent can be made available to me via my patient portal ([facility]), or I can request a printed copy by calling the office of [facility].    I understand that my insurance will be billed for this visit.   I have read or had this consent read to me. I understand the contents of this consent, which adequately explains the benefits and risks of the Services being provided via telemedicine.  I have been provided ample opportunity to ask questions regarding this consent and the Services and have had my questions answered to my satisfaction. I give my informed consent for the services to be provided through the use of telemedicine in my medical care

## 2022-06-09 ENCOUNTER — Ambulatory Visit (INDEPENDENT_AMBULATORY_CARE_PROVIDER_SITE_OTHER): Payer: No Typology Code available for payment source | Admitting: Family

## 2022-06-09 ENCOUNTER — Encounter (HOSPITAL_BASED_OUTPATIENT_CLINIC_OR_DEPARTMENT_OTHER): Payer: Self-pay | Admitting: Family

## 2022-06-09 VITALS — BP 120/72 | Ht 70.0 in

## 2022-06-09 DIAGNOSIS — Z01818 Encounter for other preprocedural examination: Secondary | ICD-10-CM

## 2022-06-09 NOTE — Progress Notes (Signed)
Virtual Visit via Telephone Note   Because of Jonathen J Sorber's co-morbid illnesses, he is at least at moderate risk for complications without adequate follow up.  This format is felt to be most appropriate for this patient at this time.  The patient did not have access to video technology/had technical difficulties with video requiring transitioning to audio format only (telephone).  All issues noted in this document were discussed and addressed.  No physical exam could be performed with this format.  Please refer to the patient's chart for his consent to telehealth for [facility].    Date:  06/09/2022   ID:  Marjorie Smolder, DOB 05/11/1951, MRN [id number] The patient was identified using 2 identifiers.  Patient Location: Home Provider Location: Home Office   PCP:  [facility], [facility] Cardiologist:  Quay Burow, MD     Evaluation Performed:  Follow-Up Visit  Chief Complaint:  Preop clearance  History of Present Illness:    LIO WEHRLY is a 71 y.o. male with HTN, HLD, etoh abuse.  Last seen 03/13/22 by Jory Sims 03/13/22. Doing well from a cardiac perspective.   Telephone today for preop clearance for foot surgery 06/19/22 with Dr. Caprice Beaver.   Reports no shortness of breath nor dyspnea on exertion. Reports no chest pain, pressure, or tightness. No edema, orthopnea, PND. Reports no palpitations.    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   Nasal fracture    S/P    Prostatic hypertrophy, benign    Tobacco abuse, in remission    cigars x2 a day   Past Surgical History:  Procedure Laterality Date   APPENDECTOMY     BUNIONECTOMY     COLONOSCOPY   06/30/2006   Remote polypectomy; normal rectum/left-side transverse diverticula, pedunculated polyp in left colon,  path with acutely and chronically inflammed granulation tissue with surface ulceration   COLONOSCOPY  09/01/2011   Procedure: COLONOSCOPY;  Surgeon: Daneil Dolin, MD;  Location: [facility];  Service: Endoscopy;  Laterality: N/A;  12:30   COLONOSCOPY N/A 12/24/2016   Dr. Gala Romney: 3 semi-pedunculated polyps measuring 4 to 6 mm in size removed from the ascending colon and cecum.  Diverticulosis.  Pathology revealed tubular adenomas.  Next colonoscopy planned for April 2021.   COLONOSCOPY WITH PROPOFOL N/A 07/08/2020   Procedure: COLONOSCOPY WITH PROPOFOL;  Surgeon: Daneil Dolin, MD;  Location: [facility];  Service: Endoscopy;  Laterality: N/A;  12:15pm   RHINOPLASTY       Current Meds  Medication Sig   amLODipine (NORVASC) 10 MG tablet Take 10 mg by mouth daily.   aspirin EC 81 MG tablet Take 81 mg by mouth daily.   atorvastatin (LIPITOR) 40 MG tablet Take 1 tablet (40 mg total) by mouth daily.   doxycycline (VIBRAMYCIN) 50 MG capsule Take 50 mg by mouth daily.   hyoscyamine (LEVSIN) 0.125 MG tablet Take 0.125 mg by mouth daily as needed (GI pain).   ketotifen (ZADITOR) 0.025 % ophthalmic solution Place 1 drop into both eyes daily as needed (allergies).   labetalol (NORMODYNE) 100 MG tablet Take 2 tablets (200 mg) each  morning and 3 tablets (300 mg) each evening.   loratadine (CLARITIN) 10 MG tablet Take 10 mg by mouth daily as needed for allergies.   MINOXIDIL, TOPICAL, 5 % SOLN Apply 1 application topically daily.   Multiple Vitamin (MULTIVITAMIN) capsule Take 1 capsule by mouth daily.   naproxen sodium (ALEVE) 220 MG tablet Take 220-440 mg by mouth 2 (two) times daily as needed (pain).   olmesartan (BENICAR) 40 MG tablet Take 1 tablet (40 mg total) by mouth daily.   omeprazole (PRILOSEC) 40 MG capsule Take 40 mg by mouth daily.   tamsulosin (FLOMAX) 0.4 MG CAPS capsule  0.4 mg daily.     Allergies:   Patient has no known allergies.   Social History   Tobacco Use   Smoking status: Every Day    Types: Cigars   Smokeless tobacco: Never   Tobacco comments:    2 cigars daily  Substance Use Topics   Alcohol use: Yes    Alcohol/week: 14.0 standard drinks of alcohol    Types: 7 Glasses of wine, 7 Shots of liquor per week    Comment: 2 glasses of wine every night and glass of bourbon   Drug use: No     Family Hx: The patient's family history includes Heart attack in his father. There is no history of Colon cancer.  ROS:   Please see the history of present illness.     All other systems reviewed and are negative.   Prior CV studies:   The following studies were reviewed today:  None  Labs/Other Tests and Data Reviewed:    EKG:  No ECG reviewed.  Recent Labs: 03/30/2022: ALT 18   Recent Lipid Panel Lab Results  Component Value Date/Time   CHOL 163 08/26/2012 09:50 AM   TRIG 85 08/26/2012 09:50 AM   HDL 59 08/26/2012 09:50 AM   CHOLHDL 2.8 08/26/2012 09:50 AM   LDLCALC 87 08/26/2012 09:50 AM    Wt Readings from Last 3 Encounters:  05/04/22 209 lb 3.2 oz (94.9 kg)  03/13/22 202 lb 6.4 oz (91.8 kg)  01/27/22 212 lb 3.2 oz (96.3 kg)        Objective:    Vital Signs:  BP 120/72 Comment: at Dr. Nolon Rod office this week  Ht '5\' 10"'$  (1.778 m)   BMI 30.02 kg/m    Given telephonic nature of communication, physical exam is limited: Alert and oriented x3. No acute distress. Normal affect. Speech and respirations are unlabored.   ASSESSMENT & PLAN:    Preop clearance - Able to achieve >4 METS working on his pharm. Without anginal symptoms. He is deemed acceptable risk for planned procedure without additional cardiovascular testing. Will route to surgical team so they are aware. May hold Aspirin 5-7 days prior to planned procedure.       Time:   Today, I have spent 6 minutes with the patient with telehealth technology discussing the  above problems.     Medication Adjustments/Labs and Tests Ordered: Current medicines are reviewed at length with the patient today.  Concerns regarding medicines are outlined above.   Tests Ordered: No orders of the defined types were placed in this encounter.   Medication Changes: No orders of the defined types were placed in this encounter.   Follow Up:  In Person  02/2023  Signed, Loel Dubonnet, NP  06/09/2022 2:26 PM    [facility]

## 2022-06-11 ENCOUNTER — Other Ambulatory Visit: Payer: Self-pay | Admitting: Cardiovascular Disease

## 2022-06-11 DIAGNOSIS — I10 Essential (primary) hypertension: Secondary | ICD-10-CM

## 2022-06-12 ENCOUNTER — Other Ambulatory Visit: Payer: Self-pay | Admitting: Podiatry

## 2022-06-15 NOTE — Patient Instructions (Signed)
Your procedure is scheduled on: 06/19/2022  Report to [facility] at  6:00   AM.  Call this number if you have problems the morning of surgery: [phone]   Remember:   Do not Eat or Drink after midnight         No Smoking the morning of surgery  :  Take these medicines the morning of surgery with A SIP OF WATER: Amlodipine, labatalol, and omeprazole   Do not wear jewelry, make-up or nail polish.  Do not wear lotions, powders, or perfumes. You may wear deodorant.  Do not shave 48 hours prior to surgery. Men may shave face and neck.  Do not bring valuables to the hospital.  Contacts, dentures or bridgework may not be worn into surgery.  Leave suitcase in the car. After surgery it may be brought to your room.  For patients admitted to the hospital, checkout time is 11:00 AM the day of discharge.   Patients discharged the day of surgery will not be allowed to drive home.    Special Instructions: Shower using CHG night before surgery and shower the day of surgery use CHG.  Use special wash - you have one bottle of CHG for all showers.  You should use approximately 1/2 of the bottle for each shower.  How to Use Chlorhexidine Before Surgery Chlorhexidine gluconate (CHG) is a germ-killing (antiseptic) solution that is used to clean the skin. It can get rid of the bacteria that normally live on the skin and can keep them away for about 24 hours. To clean your skin with CHG, you may be given: A CHG solution to use in the shower or as part of a sponge bath. A prepackaged cloth that contains CHG. Cleaning your skin with CHG may help lower the risk for infection: While you are staying in the intensive care unit of the hospital. If you have a vascular access, such as a central line, to provide short-term or long-term access to your veins. If you have a catheter to drain urine from your bladder. If you are on a ventilator. A ventilator is a machine that helps you breathe by moving  air in and out of your lungs. After surgery. What are the risks? Risks of using CHG include: A skin reaction. Hearing loss, if CHG gets in your ears and you have a perforated eardrum. Eye injury, if CHG gets in your eyes and is not rinsed out. The CHG product catching fire. Make sure that you avoid smoking and flames after applying CHG to your skin. Do not use CHG: If you have a chlorhexidine allergy or have previously reacted to chlorhexidine. On babies younger than 42 months of age. How to use CHG solution Use CHG only as told by your health care provider, and follow the instructions on the label. Use the full amount of CHG as directed. Usually, this is one bottle. During a shower Follow these steps when using CHG solution during a shower (unless your health care provider gives you different instructions): Start the shower. Use your normal soap and shampoo to wash your face and hair. Turn off the shower or move out of the shower stream. Pour the CHG onto a clean washcloth. Do not use any type of brush or rough-edged sponge. Starting at your neck, lather your body down to your toes. Make sure you follow these instructions: If you will be having surgery, pay special attention to the part of your body where you will be  having surgery. Scrub this area for at least 1 minute. Do not use CHG on your head or face. If the solution gets into your ears or eyes, rinse them well with water. Avoid your genital area. Avoid any areas of skin that have broken skin, cuts, or scrapes. Scrub your back and under your arms. Make sure to wash skin folds. Let the lather sit on your skin for 1-2 minutes or as long as told by your health care provider. Thoroughly rinse your entire body in the shower. Make sure that all body creases and crevices are rinsed well. Dry off with a clean towel. Do not put any substances on your body afterward--such as powder, lotion, or perfume--unless you are told to do so by your  health care provider. Only use lotions that are recommended by the manufacturer. Put on clean clothes or pajamas. If it is the night before your surgery, sleep in clean sheets.  During a sponge bath Follow these steps when using CHG solution during a sponge bath (unless your health care provider gives you different instructions): Use your normal soap and shampoo to wash your face and hair. Pour the CHG onto a clean washcloth. Starting at your neck, lather your body down to your toes. Make sure you follow these instructions: If you will be having surgery, pay special attention to the part of your body where you will be having surgery. Scrub this area for at least 1 minute. Do not use CHG on your head or face. If the solution gets into your ears or eyes, rinse them well with water. Avoid your genital area. Avoid any areas of skin that have broken skin, cuts, or scrapes. Scrub your back and under your arms. Make sure to wash skin folds. Let the lather sit on your skin for 1-2 minutes or as long as told by your health care provider. Using a different clean, wet washcloth, thoroughly rinse your entire body. Make sure that all body creases and crevices are rinsed well. Dry off with a clean towel. Do not put any substances on your body afterward--such as powder, lotion, or perfume--unless you are told to do so by your health care provider. Only use lotions that are recommended by the manufacturer. Put on clean clothes or pajamas. If it is the night before your surgery, sleep in clean sheets. How to use CHG prepackaged cloths Only use CHG cloths as told by your health care provider, and follow the instructions on the label. Use the CHG cloth on clean, dry skin. Do not use the CHG cloth on your head or face unless your health care provider tells you to. When washing with the CHG cloth: Avoid your genital area. Avoid any areas of skin that have broken skin, cuts, or scrapes. Before surgery Follow  these steps when using a CHG cloth to clean before surgery (unless your health care provider gives you different instructions): Using the CHG cloth, vigorously scrub the part of your body where you will be having surgery. Scrub using a back-and-forth motion for 3 minutes. The area on your body should be completely wet with CHG when you are done scrubbing. Do not rinse. Discard the cloth and let the area air-dry. Do not put any substances on the area afterward, such as powder, lotion, or perfume. Put on clean clothes or pajamas. If it is the night before your surgery, sleep in clean sheets.  For general bathing Follow these steps when using CHG cloths for general bathing (unless your  health care provider gives you different instructions). Use a separate CHG cloth for each area of your body. Make sure you wash between any folds of skin and between your fingers and toes. Wash your body in the following order, switching to a new cloth after each step: The front of your neck, shoulders, and chest. Both of your arms, under your arms, and your hands. Your stomach and groin area, avoiding the genitals. Your right leg and foot. Your left leg and foot. The back of your neck, your back, and your buttocks. Do not rinse. Discard the cloth and let the area air-dry. Do not put any substances on your body afterward--such as powder, lotion, or perfume--unless you are told to do so by your health care provider. Only use lotions that are recommended by the manufacturer. Put on clean clothes or pajamas. Contact a health care provider if: Your skin gets irritated after scrubbing. You have questions about using your solution or cloth. You swallow any chlorhexidine. Call your local poison control center (1-605-075-8574 in the [place] Get help right away if: Your eyes itch badly, or they become very red or swollen. Your skin itches badly and is red or swollen. Your hearing changes. You have trouble seeing. You have  swelling or tingling in your mouth or throat. You have trouble breathing. These symptoms may represent a serious problem that is an emergency. Do not wait to see if the symptoms will go away. Get medical help right away. Call your local emergency services (911 in the [place] Do not drive yourself to the hospital. Summary Chlorhexidine gluconate (CHG) is a germ-killing (antiseptic) solution that is used to clean the skin. Cleaning your skin with CHG may help to lower your risk for infection. You may be given CHG to use for bathing. It may be in a bottle or in a prepackaged cloth to use on your skin. Carefully follow your health care provider's instructions and the instructions on the product label. Do not use CHG if you have a chlorhexidine allergy. Contact your health care provider if your skin gets irritated after scrubbing. This information is not intended to replace advice given to you by your health care provider. Make sure you discuss any questions you have with your health care provider. Document Revised: 12/29/2021 Document Reviewed: 11/11/2020 Elsevier Patient Education  [place]. Toe Deformity Repair, Care After This sheet gives you information about how to care for yourself after your procedure. Your health care provider may also give you more specific instructions. If you have problems or questions, contact your health care provider. What can I expect after the procedure? After the procedure, it is common to have: Pain in the affected area. Discomfort with walking. Follow these instructions at home: If you have a postoperative shoe:  Wear the shoe as told by your health care provider. Remove it only as told by your health care provider. Loosen the shoe if your toes tingle, become numb, or turn cold and blue. Keep the shoe clean and dry. Bathing Do not take baths, swim, or use a hot tub until your health care provider approves. Ask your health care provider if you can take  showers. You may only be allowed to take sponge baths. If your postoperative shoe is not waterproof, cover it with a watertight covering when you take a bath or a shower. Keep the bandage (dressing) dry until your health care provider says it can be removed. Incision care  Follow instructions from your health care  provider about how to take care of your incision. Make sure you: Wash your hands with soap and water for at least 20 seconds before and after you change your dressing. If soap and water are not available, use hand sanitizer. Change your dressing as told by your health care provider. Leave stitches (sutures), skin glue, or adhesive strips in place. These skin closures may need to stay in place for 2 weeks or longer. If adhesive strip edges start to loosen and curl up, you may trim the loose edges. Do not remove adhesive strips completely unless your health care provider tells you to do that. Check your incision area every day for signs of infection. Check for: More redness, swelling, or pain. Fluid or blood. Warmth. Pus or a bad smell. Managing pain, stiffness, and swelling  If directed, put ice on the affected area. To do this: Put ice in a plastic bag. Place a towel between your skin and the bag. Leave the ice on for 20 minutes, 2-3 times a day. Move your toes often to avoid stiffness and to lessen swelling. Raise (elevate) the affected foot above the level of your heart while you are sitting or lying down. Driving Ask your health care provider if the medicine prescribed to you requires you to avoid driving or using machinery. Ask your health care provider when it is safe to drive if you have a postoperative shoe on your foot. Activity Walk and return to your normal activities as told by your health care provider. Ask your health care provider what activities are safe for you. Do not use your affected foot to support your body weight until your health care provider says that you  can. Use crutches as directed by your health care provider. Do exercises as told by your health care provider or physical therapist. General instructions Take over-the-counter and prescription medicines only as told by your health care provider. Ask your health care provider if the medicine prescribed to you can cause constipation. You may need to take these actions to prevent or treat constipation: Drink enough fluid to keep your urine pale yellow. Take over-the-counter or prescription medicines. Eat foods that are high in fiber, such as beans, whole grains, and fresh fruits and vegetables. Limit foods that are high in fat and processed sugars, such as fried or sweet foods. Do not use any products that contain nicotine or tobacco, such as cigarettes, e-cigarettes, and chewing tobacco. These can delay bone healing. If you need help quitting, ask your health care provider. Keep all follow-up visits. This is important. Contact a health care provider if: You have more redness, swelling, or pain at your incision site. You notice redness extending from the surgical site upward. You have fluid or blood coming from your incision. Your incision feels warm to the touch. You have pus or a bad smell coming from the incision area or the dressing. Your leg swells. You have a fever. Get help right away if: You develop a rash. You have chest pain or difficulty breathing. These symptoms may represent a serious problem that is an emergency. Do not wait to see if the symptoms will go away. Get medical help right away. Call your local emergency services (911 in the [place] Do not drive yourself to the hospital. Summary After the procedure, it is common to have pain in the affected area and discomfort with walking. Follow instructions from your health care provider about how to take care of your incision. Do not use your  affected foot to support your body weight until your health care provider says that you  can. Use crutches as directed by your health care provider. Walk and return to your normal activities as told by your health care provider. Ask your health care provider what activities are safe for you. Keep all follow-up visits as told by your health care provider. This information is not intended to replace advice given to you by your health care provider. Make sure you discuss any questions you have with your health care provider. Document Revised: 12/07/2019 Document Reviewed: 12/07/2019 Elsevier Patient Education  [place] Anesthesia, Adult, Care After The following information offers guidance on how to care for yourself after your procedure. Your health care provider may also give you more specific instructions. If you have problems or questions, contact your health care provider. What can I expect after the procedure? After the procedure, it is common for people to: Have pain or discomfort at the IV site. Have nausea or vomiting. Have a sore throat or hoarseness. Have trouble concentrating. Feel cold or chills. Feel weak, sleepy, or tired (fatigue). Have soreness and body aches. These can affect parts of the body that were not involved in surgery. Follow these instructions at home: For the time period you were told by your health care provider:  Rest. Do not participate in activities where you could fall or become injured. Do not drive or use machinery. Do not drink alcohol. Do not take sleeping pills or medicines that cause drowsiness. Do not make important decisions or sign legal documents. Do not take care of children on your own. General instructions Drink enough fluid to keep your urine pale yellow. If you have sleep apnea, surgery and certain medicines can increase your risk for breathing problems. Follow instructions from your health care provider about wearing your sleep device: Anytime you are sleeping, including during daytime naps. While taking  prescription pain medicines, sleeping medicines, or medicines that make you drowsy. Return to your normal activities as told by your health care provider. Ask your health care provider what activities are safe for you. Take over-the-counter and prescription medicines only as told by your health care provider. Do not use any products that contain nicotine or tobacco. These products include cigarettes, chewing tobacco, and vaping devices, such as e-cigarettes. These can delay incision healing after surgery. If you need help quitting, ask your health care provider. Contact a health care provider if: You have nausea or vomiting that does not get better with medicine. You vomit every time you eat or drink. You have pain that does not get better with medicine. You cannot urinate or have bloody urine. You develop a skin rash. You have a fever. Get help right away if: You have trouble breathing. You have chest pain. You vomit blood. These symptoms may be an emergency. Get help right away. Call 911. Do not wait to see if the symptoms will go away. Do not drive yourself to the hospital. Summary After the procedure, it is common to have a sore throat, hoarseness, nausea, vomiting, or to feel weak, sleepy, or fatigue. For the time period you were told by your health care provider, do not drive or use machinery. Get help right away if you have difficulty breathing, have chest pain, or vomit blood. These symptoms may be an emergency. This information is not intended to replace advice given to you by your health care provider. Make sure you discuss any questions you have with your  health care provider. Document Revised: 11/28/2021 Document Reviewed: 11/28/2021 Elsevier Patient Education  [place].

## 2022-06-17 ENCOUNTER — Encounter (HOSPITAL_COMMUNITY): Payer: Self-pay

## 2022-06-17 ENCOUNTER — Ambulatory Visit (HOSPITAL_COMMUNITY)
Admission: RE | Admit: 2022-06-17 | Discharge: 2022-06-17 | Disposition: A | Discharge status: Home / Self Care | Payer: No Typology Code available for payment source | Source: Ambulatory Visit | Attending: Podiatry | Admitting: Podiatry

## 2022-06-17 ENCOUNTER — Encounter (HOSPITAL_COMMUNITY)
Admission: RE | Admit: 2022-06-17 | Discharge: 2022-06-17 | Disposition: A | Discharge status: Home / Self Care | Payer: No Typology Code available for payment source | Source: Ambulatory Visit | Attending: Podiatry | Admitting: Podiatry

## 2022-06-17 ENCOUNTER — Other Ambulatory Visit (HOSPITAL_COMMUNITY): Payer: Self-pay | Admitting: Podiatry

## 2022-06-17 DIAGNOSIS — M79672 Pain in left foot: Secondary | ICD-10-CM | POA: Diagnosis not present

## 2022-06-17 NOTE — Progress Notes (Signed)
   06/17/22 1105  OBSTRUCTIVE SLEEP APNEA  Have you ever been diagnosed with sleep apnea through a sleep study? No  Do you snore loudly (loud enough to be heard through closed doors)?  1  Do you often feel tired, fatigued, or sleepy during the daytime (such as falling asleep during driving or talking to someone)? 0  Has anyone observed you stop breathing during your sleep? 1  Do you have, or are you being treated for high blood pressure? 1  BMI more than 35 kg/m2? 0  Age > 50 (1-yes) 1  Neck circumference greater than:Male 16 inches or larger, Male 17inches or larger? 0  Male Gender (Yes=1) 1  Obstructive Sleep Apnea Score 5

## 2022-06-19 ENCOUNTER — Ambulatory Visit (HOSPITAL_COMMUNITY): Payer: No Typology Code available for payment source

## 2022-06-19 ENCOUNTER — Ambulatory Visit (HOSPITAL_COMMUNITY): Payer: No Typology Code available for payment source | Admitting: Anesthesiology

## 2022-06-19 ENCOUNTER — Encounter (HOSPITAL_COMMUNITY): Payer: Self-pay | Admitting: Podiatry

## 2022-06-19 ENCOUNTER — Other Ambulatory Visit: Payer: Self-pay

## 2022-06-19 ENCOUNTER — Encounter (HOSPITAL_COMMUNITY)
Admission: RE | Disposition: A | Discharge status: Home / Self Care | Payer: Self-pay | Source: Home / Self Care | Attending: Podiatry

## 2022-06-19 ENCOUNTER — Ambulatory Visit (HOSPITAL_COMMUNITY)
Admission: RE | Admit: 2022-06-19 | Discharge: 2022-06-19 | Disposition: A | Discharge status: Home / Self Care | Payer: No Typology Code available for payment source | Attending: Podiatry | Admitting: Podiatry

## 2022-06-19 ENCOUNTER — Ambulatory Visit (HOSPITAL_BASED_OUTPATIENT_CLINIC_OR_DEPARTMENT_OTHER): Payer: No Typology Code available for payment source | Admitting: Anesthesiology

## 2022-06-19 DIAGNOSIS — R0683 Snoring: Secondary | ICD-10-CM | POA: Diagnosis not present

## 2022-06-19 DIAGNOSIS — X58XXXA Exposure to other specified factors, initial encounter: Secondary | ICD-10-CM | POA: Diagnosis not present

## 2022-06-19 DIAGNOSIS — M2012 Hallux valgus (acquired), left foot: Secondary | ICD-10-CM | POA: Insufficient documentation

## 2022-06-19 DIAGNOSIS — Y939 Activity, unspecified: Secondary | ICD-10-CM | POA: Diagnosis not present

## 2022-06-19 DIAGNOSIS — F17200 Nicotine dependence, unspecified, uncomplicated: Secondary | ICD-10-CM | POA: Diagnosis not present

## 2022-06-19 DIAGNOSIS — K219 Gastro-esophageal reflux disease without esophagitis: Secondary | ICD-10-CM | POA: Diagnosis not present

## 2022-06-19 DIAGNOSIS — S93125A Dislocation of metatarsophalangeal joint of left lesser toe(s), initial encounter: Secondary | ICD-10-CM | POA: Diagnosis not present

## 2022-06-19 DIAGNOSIS — I10 Essential (primary) hypertension: Secondary | ICD-10-CM | POA: Insufficient documentation

## 2022-06-19 DIAGNOSIS — M2042 Other hammer toe(s) (acquired), left foot: Secondary | ICD-10-CM | POA: Insufficient documentation

## 2022-06-19 DIAGNOSIS — Z472 Encounter for removal of internal fixation device: Secondary | ICD-10-CM | POA: Diagnosis not present

## 2022-06-19 DIAGNOSIS — I252 Old myocardial infarction: Secondary | ICD-10-CM | POA: Diagnosis not present

## 2022-06-19 HISTORY — PX: HARDWARE REMOVAL: SHX979

## 2022-06-19 HISTORY — PX: METATARSAL OSTEOTOMY: SHX1641

## 2022-06-19 HISTORY — PX: WEIL OSTEOTOMY: SHX5044

## 2022-06-19 HISTORY — PX: HAMMER TOE SURGERY: SHX385

## 2022-06-19 SURGERY — OSTEOTOMY, METATARSAL BONE
Anesthesia: General | Site: Toe | Laterality: Left

## 2022-06-19 MED ORDER — BUPIVACAINE HCL (PF) 0.5 % IJ SOLN
INTRAMUSCULAR | Status: DC | PRN
  Administered 2022-06-19: 30 mL

## 2022-06-19 MED ORDER — LACTATED RINGERS IV SOLN
INTRAVENOUS | Status: DC
  Administered 2022-06-19: 1000 mL via INTRAVENOUS

## 2022-06-19 MED ORDER — CHLORHEXIDINE GLUCONATE 0.12 % MT SOLN
OROMUCOSAL | Status: AC
  Filled 2022-06-19: qty 15

## 2022-06-19 MED ORDER — DEXAMETHASONE SODIUM PHOSPHATE 10 MG/ML IJ SOLN
INTRAMUSCULAR | Status: DC | PRN
  Administered 2022-06-19: 4 mg via INTRAVENOUS

## 2022-06-19 MED ORDER — FENTANYL CITRATE (PF) 100 MCG/2ML IJ SOLN
INTRAMUSCULAR | Status: AC
  Filled 2022-06-19: qty 2

## 2022-06-19 MED ORDER — LIDOCAINE HCL (PF) 2 % IJ SOLN
INTRAMUSCULAR | Status: AC
  Filled 2022-06-19: qty 10

## 2022-06-19 MED ORDER — ORAL CARE MOUTH RINSE: 15.0000 mL | Freq: Once | OROMUCOSAL | Status: AC

## 2022-06-19 MED ORDER — MIDAZOLAM HCL 2 MG/2ML IJ SOLN
INTRAMUSCULAR | Status: AC
  Filled 2022-06-19: qty 2

## 2022-06-19 MED ORDER — FENTANYL CITRATE (PF) 100 MCG/2ML IJ SOLN
INTRAMUSCULAR | Status: DC | PRN
  Administered 2022-06-19 (×2): 50 ug via INTRAVENOUS
  Administered 2022-06-19: 100 ug via INTRAVENOUS

## 2022-06-19 MED ORDER — PHENYLEPHRINE HCL (PRESSORS) 10 MG/ML IV SOLN
INTRAVENOUS | Status: AC
  Filled 2022-06-19: qty 1

## 2022-06-19 MED ORDER — PHENYLEPHRINE HCL-NACL 20-0.9 MG/250ML-% IV SOLN
INTRAVENOUS | Status: DC | PRN
  Administered 2022-06-19: 50 ug/min via INTRAVENOUS

## 2022-06-19 MED ORDER — 0.9 % SODIUM CHLORIDE (POUR BTL) OPTIME
TOPICAL | Status: DC | PRN
  Administered 2022-06-19: 1000 mL

## 2022-06-19 MED ORDER — PROPOFOL 10 MG/ML IV BOLUS
INTRAVENOUS | Status: DC | PRN
  Administered 2022-06-19: 150 mg via INTRAVENOUS
  Administered 2022-06-19: 50 mg via INTRAVENOUS

## 2022-06-19 MED ORDER — MIDAZOLAM HCL 5 MG/5ML IJ SOLN
INTRAMUSCULAR | Status: DC | PRN
  Administered 2022-06-19: 2 mg via INTRAVENOUS

## 2022-06-19 MED ORDER — CEFAZOLIN SODIUM-DEXTROSE 2-4 GM/100ML-% IV SOLN
2.0000 g | Freq: Once | INTRAVENOUS | Status: AC
  Administered 2022-06-19: 2 g via INTRAVENOUS
  Filled 2022-06-19: qty 100

## 2022-06-19 MED ORDER — LIDOCAINE HCL (CARDIAC) PF 100 MG/5ML IV SOSY
PREFILLED_SYRINGE | INTRAVENOUS | Status: DC | PRN
  Administered 2022-06-19: 50 mg via INTRAVENOUS

## 2022-06-19 MED ORDER — PROPOFOL 10 MG/ML IV BOLUS
INTRAVENOUS | Status: AC
  Filled 2022-06-19: qty 20

## 2022-06-19 MED ORDER — EPHEDRINE SULFATE (PRESSORS) 50 MG/ML IJ SOLN
INTRAMUSCULAR | Status: DC | PRN
  Administered 2022-06-19: 5 mg via INTRAVENOUS
  Administered 2022-06-19: 10 mg via INTRAVENOUS
  Administered 2022-06-19 (×2): 5 mg via INTRAVENOUS

## 2022-06-19 MED ORDER — PHENYLEPHRINE HCL (PRESSORS) 10 MG/ML IV SOLN
INTRAVENOUS | Status: DC | PRN
  Administered 2022-06-19 (×5): 160 ug via INTRAVENOUS

## 2022-06-19 MED ORDER — HYDROMORPHONE HCL 1 MG/ML IJ SOLN: 0.2500 mg | INTRAMUSCULAR | Status: DC | PRN

## 2022-06-19 MED ORDER — CHLORHEXIDINE GLUCONATE 0.12 % MT SOLN
15.0000 mL | Freq: Once | OROMUCOSAL | Status: AC
  Administered 2022-06-19: 15 mL via OROMUCOSAL

## 2022-06-19 MED ORDER — BUPIVACAINE HCL (PF) 0.5 % IJ SOLN
INTRAMUSCULAR | Status: AC
  Filled 2022-06-19: qty 30

## 2022-06-19 MED ORDER — ONDANSETRON HCL 4 MG/2ML IJ SOLN
INTRAMUSCULAR | Status: DC | PRN
  Administered 2022-06-19: 4 mg via INTRAVENOUS

## 2022-06-19 MED ORDER — ONDANSETRON HCL 4 MG/2ML IJ SOLN: 4.0000 mg | Freq: Once | INTRAMUSCULAR | Status: DC | PRN

## 2022-06-19 SURGICAL SUPPLY — 77 items
APL PRP STRL LF DISP 70% ISPRP (MISCELLANEOUS) ×2
APL SKNCLS STERI-STRIP NONHPOA (GAUZE/BANDAGES/DRESSINGS) ×2
BANDAGE ESMARK 4X12 BL STRL LF (DISPOSABLE) ×2 IMPLANT
BENZOIN TINCTURE PRP APPL 2/3 (GAUZE/BANDAGES/DRESSINGS) ×2 IMPLANT
BIT DRILL CANNULATED 4MM (BIT) IMPLANT
BIT DRILL MICR ACTRK 2 LNG PRF (BIT) IMPLANT
BLADE AVERAGE 25X9 (BLADE) ×2 IMPLANT
BLADE OSC/SAG 11.5X5.5X.38 (BLADE) IMPLANT
BLADE OSC/SAGITTAL MD 5.5X18 (BLADE) IMPLANT
BLADE SURG 15 STRL LF DISP TIS (BLADE) ×4 IMPLANT
BLADE SURG 15 STRL SS (BLADE) ×4
BNDG CMPR 12X4 ELC STRL LF (DISPOSABLE) ×2
BNDG CMPR STD VLCR NS LF 5.8X4 (GAUZE/BANDAGES/DRESSINGS) ×2
BNDG CONFORM 2 STRL LF (GAUZE/BANDAGES/DRESSINGS) ×2 IMPLANT
BNDG ELASTIC 4X5.8 VLCR NS LF (GAUZE/BANDAGES/DRESSINGS) ×2 IMPLANT
BNDG ELASTIC 4X5.8 VLCR STR LF (GAUZE/BANDAGES/DRESSINGS) IMPLANT
BNDG ESMARK 4X12 BLUE STRL LF (DISPOSABLE) ×2
BNDG GAUZE DERMACEA FLUFF 4 (GAUZE/BANDAGES/DRESSINGS) IMPLANT
BNDG GAUZE ELAST 4 BULKY (GAUZE/BANDAGES/DRESSINGS) ×2 IMPLANT
BNDG GZE DERMACEA 4 6PLY (GAUZE/BANDAGES/DRESSINGS) ×2
BOOT STEPPER DURA LG (SOFTGOODS) IMPLANT
BOOT STEPPER DURA MED (SOFTGOODS) IMPLANT
BOOT STEPPER DURA SM (SOFTGOODS) IMPLANT
BOOT STEPPER DURA XLG (SOFTGOODS) IMPLANT
CAP PIN ORTHO PINK (CAP) IMPLANT
CAP PIN PROTECTOR ORTHO WHT (CAP) IMPLANT
CHLORAPREP W/TINT 26 (MISCELLANEOUS) ×2 IMPLANT
CLOTH BEACON ORANGE TIMEOUT ST (SAFETY) ×2 IMPLANT
COVER LIGHT HANDLE STERIS (MISCELLANEOUS) ×4 IMPLANT
CUFF TOURN SGL QUICK 18X4 (TOURNIQUET CUFF) ×2 IMPLANT
DRILL CANNULATED 4MM (BIT) ×2
DRILL MICRO ACUTRAK 2 LNG PROF (BIT) ×2
DRSG ADAPTIC 3X8 NADH LF (GAUZE/BANDAGES/DRESSINGS) ×2 IMPLANT
ELECT REM PT RETURN 9FT ADLT (ELECTROSURGICAL) ×2
ELECTRODE REM PT RTRN 9FT ADLT (ELECTROSURGICAL) ×2 IMPLANT
GAUZE SPONGE 4X4 12PLY STRL (GAUZE/BANDAGES/DRESSINGS) ×2 IMPLANT
GLOVE BIO SURGEON STRL SZ7.5 (GLOVE) ×2 IMPLANT
GLOVE BIOGEL PI IND STRL 7.0 (GLOVE) ×4 IMPLANT
GOWN STRL REUS W/ TWL LRG LVL3 (GOWN DISPOSABLE) ×2 IMPLANT
GOWN STRL REUS W/ TWL XL LVL3 (GOWN DISPOSABLE) ×2 IMPLANT
GOWN STRL REUS W/TWL LRG LVL3 (GOWN DISPOSABLE) ×6 IMPLANT
GOWN STRL REUS W/TWL XL LVL3 (GOWN DISPOSABLE) ×2
GUIDEWIRE 0.9X100 SMOOTH (WIRE) IMPLANT
GUIDEWIRE ORTHO MICROSHT .035 (WIRE) IMPLANT
GUIDWIRE 0.90MM X 100MM SMOOTH (WIRE) ×2
K-WIRE 6 (WIRE) ×2
K-WIRE DBL END TROCAR 6X.062 (WIRE) ×2
K-WIRE FIXATION CANN 1.1X120 (WIRE) ×2
KIT TURNOVER KIT A (KITS) ×2 IMPLANT
KWIRE 6 (WIRE) IMPLANT
KWIRE DBL END TROCAR 6X.062 (WIRE) IMPLANT
KWIRE FIXATION CANN 1.1X120 (WIRE) IMPLANT
MANIFOLD NEPTUNE II (INSTRUMENTS) ×2 IMPLANT
NDL HYPO 27GX1-1/4 (NEEDLE) ×6 IMPLANT
NEEDLE HYPO 27GX1-1/4 (NEEDLE) ×6 IMPLANT
NS IRRIG 1000ML POUR BTL (IV SOLUTION) ×2 IMPLANT
OVER DRILL (DRILL) IMPLANT
PACK BASIC LIMB (CUSTOM PROCEDURE TRAY) ×2 IMPLANT
PAD ARMBOARD 7.5X6 YLW CONV (MISCELLANEOUS) ×2 IMPLANT
PIN CAPS ORTHO GREEN .062 (PIN) IMPLANT
RASP SM TEAR CROSS CUT (RASP) IMPLANT
SCREW ACUTRAK 2 MICRO 20MM (Screw) IMPLANT
SCREW BONE TI6 LP 4.0X26 (Screw) IMPLANT
SCREW LP 2.0X10 (Screw) IMPLANT
SET BASIN LINEN APH (SET/KITS/TRAYS/PACK) ×2 IMPLANT
SPONGE LAP 4X18 RFD (DISPOSABLE) ×4 IMPLANT
SPONGE T-LAP 18X18 ~~LOC~~+RFID (SPONGE) ×2 IMPLANT
STAPLE BONE FIXATION 10X10 (Staple) IMPLANT
STRIP CLOSURE SKIN 1/2X4 (GAUZE/BANDAGES/DRESSINGS) ×4 IMPLANT
SUT ETHILON 3 0 FSL (SUTURE) IMPLANT
SUT ETHILON 4 0 P 3 18 (SUTURE) ×2 IMPLANT
SUT PROLENE 3 0 PS 2 (SUTURE) IMPLANT
SUT PROLENE 4 0 PS 2 18 (SUTURE) IMPLANT
SUT VIC AB 2-0 CT2 27 (SUTURE) IMPLANT
SUT VIC AB 4-0 PS2 27 (SUTURE) ×2 IMPLANT
SUT VICRYL AB 3-0 FS1 BRD 27IN (SUTURE) ×2 IMPLANT
SYR CONTROL 10ML LL (SYRINGE) ×4 IMPLANT

## 2022-06-19 NOTE — Anesthesia Postprocedure Evaluation (Signed)
Anesthesia Post Note  Patient: Shane [name]  Procedure(s) Performed: CLOSING BASE WEDGE OSTEOTOMY OF FIRST METATARSAL LEFT FOOT (Left: Toe) DISTAL FIRST OSTEOTOMY WITH REMOVAL OF HARDWARE (Left: Foot) HAMMER TOE CORRECTION 2ND TOE LEFT FOOT (Left: Toe) WEIL OSTEOTOMY 2ND METARTSAL LEFT FOOT (Left: Foot)  Patient location during evaluation: Phase II Anesthesia Type: General Level of consciousness: awake and alert and oriented Pain management: pain level controlled Vital Signs Assessment: post-procedure vital signs reviewed and stable Respiratory status: spontaneous breathing, nonlabored ventilation and respiratory function stable Cardiovascular status: blood pressure returned to baseline and stable Postop Assessment: no apparent nausea or vomiting Anesthetic complications: no   No notable events documented.   Last Vitals:  Vitals:   06/19/22 1100 06/19/22 1115  BP: 122/73 104/68  Pulse: 77 91  Resp: 12 (!) 25  Temp:    SpO2: 93% 93%    Last Pain:  Vitals:   06/19/22 1115  TempSrc:   PainSc: 0-No pain                 Trystin Hargrove C Lina Hitch

## 2022-06-19 NOTE — Anesthesia Preprocedure Evaluation (Signed)
Anesthesia Evaluation  Patient identified by MRN, date of birth, ID band Patient awake    Reviewed: Allergy & Precautions, NPO status , Patient's Chart, lab work & pertinent test results, reviewed documented beta blocker date and time   History of Anesthesia Complications Negative for: history of anesthetic complications  Airway Mallampati: III  TM Distance: >3 FB Neck ROM: Full    Dental  (+) Dental Advisory Given, Teeth Intact, Caps   Pulmonary Current Smoker and Patient abstained from smoking.,    Pulmonary exam normal breath sounds clear to auscultation       Cardiovascular Exercise Tolerance: Good hypertension, Pt. on medications and Pt. on home beta blockers + Past MI  Normal cardiovascular exam Rhythm:Regular Rate:Normal     Neuro/Psych negative neurological ROS  negative psych ROS   GI/Hepatic Neg liver ROS, GERD  Medicated and Controlled,  Endo/Other  negative endocrine ROS  Renal/GU negative Renal ROS  negative genitourinary   Musculoskeletal negative musculoskeletal ROS (+)   Abdominal   Peds negative pediatric ROS (+)  Hematology negative hematology ROS (+)   Anesthesia Other Findings Snoring   Reproductive/Obstetrics negative OB ROS                            Anesthesia Physical Anesthesia Plan  ASA: 3  Anesthesia Plan: General   Post-op Pain Management: Dilaudid IV   Induction: Intravenous  PONV Risk Score and Plan: 2 and Ondansetron and Dexamethasone  Airway Management Planned: LMA  Additional Equipment:   Intra-op Plan:   Post-operative Plan: Extubation in OR  Informed Consent: I have reviewed the patients History and Physical, chart, labs and discussed the procedure including the risks, benefits and alternatives for the proposed anesthesia with the patient or authorized representative who has indicated his/her understanding and acceptance.     Dental  advisory given  Plan Discussed with: CRNA and Surgeon  Anesthesia Plan Comments:        Anesthesia Quick Evaluation

## 2022-06-19 NOTE — Discharge Instructions (Addendum)
These instructions will give you an idea of what to expect after surgery and how to manage issues that may arise before your first post op office visit.  Pain Management Pain is best managed by "staying ahead" of it. If pain gets out of control, it is difficult to get it back under control. Local anesthesia that lasts 6-8 hours is used to numb the foot and decrease pain.  For the best pain control, take the pain medication every 4 hours for the first 2 days post op. On the third day pain medication can be taken as needed.   Post Op Nausea Nausea is common after surgery, so it is managed proactively.  If prescribed, use the prescribed nausea medication regularly for the first 2 days post op.  Bandages Do not worry if there is blood on the bandage. What looks like a lot of blood on the bandage is actually a small amount. Blood on the dressing spreads out as it is absorbed by the gauze, the same way a drop of water spreads out on a paper towel.  If the bandages feel wet or dry, stiff and uncomfortable, call the office during office hours and we will schedule a time for you to have the bandage changed.  Unless you are specifically told otherwise, we will do the first bandage change in the office.  Keep your bandage dry. If the bandage becomes wet or soiled, notify the office and we will schedule a time to change the bandage.  Activity It is best to spend most of the first 2 days after surgery lying down with the foot elevated above the level of your heart. You may put weight on your heel while wearing the CAM Walker (black boot).   You may only get up to go to the restroom.  Driving Do not drive until you are able to respond in an emergency (i.e. slam on the brakes). This usually occurs after the bone has healed - 6 to 8 weeks.  Call the Office If you have a fever over 101F.  If you have increasing pain after the initial post op pain has settled down.  If you have increasing redness, swelling,  or drainage.  If you have any questions or concerns.   

## 2022-06-19 NOTE — Op Note (Signed)
OPERATIVE NOTE  DATE OF PROCEDURE 06/19/2022  SURGEON Marcheta Grammes, DPM  ASSISTANT SURGEON None  OR STAFF Circulator: Scearce, [place] E, RN Radiology Technologist: Yetta Barre, [place] Scrub Person: Karin Lieu, CST; Lucie Leather, CST   PREOPERATIVE DIAGNOSIS Retained orthopedic hardware left foot. 2.   Recurrent hallux valgus, left foot. 3.   Hammertoe deformity second toe, left foot. 4.   Dislocation of the second metatarsophalangeal joint, left foot.  POSTOPERATIVE DIAGNOSIS Same  PROCEDURE Removal of hardware, left foot. 2.   Closing base wedge osteotomy of the first metatarsal, left foot. 3.   Distal first metatarsal osteotomy, left foot 4.   Shortening osteotomy of the second metatarsal, left foot 5.   Hammertoe repair of the second digit, left foot.  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  Recurrent hallux valgus of the left foot following distal first metatarsal osteotomy.  DESCRIPTION OF THE PROCEDURE:  The patient was brought to the operating room and placed on the operative table in the supine position.  A pneumatic ankle tourniquet was applied to the operative extremity.  Following sedation, the surgical site was anesthetized with 0.5% Marcaine plain.  The foot was then prepped, scrubbed, and draped in the usual sterile technique.  The foot was elevated, exsanguinated and the pneumatic ankle tourniquet inflated to 250 mmHg.    Attention was directed to the dorsomedial aspect of the left foot where a curvilinear incision was made  medial and parallel to the extensor hallucis longus tendon tendon.  Dissection was continued deep down to the level of the first metatarsal.  A linear periosteal and capsular incision was performed extending from the base of the first metatarsal to the proximal phalanx of the hallux.  The periosteal and capsular structures were reflected medially and laterally exposing the first metatarsal.  2 retained K wires were noted in the distal first metatarsal and removed without difficulty.  Attention was directed to the first metatarsal base.  A closing base wedge osteotomy was performed in an oblique manner.  The osteotomy was oriented from a proximal medial to distal lateral direction.  A wedge of bone was removed and passed from the operative field.  The bone hinge was feathered to allow for adequate closure of the osteotomy.  The osteotomy was fixated using a 4.0 mm Integra screw.  Adequate compression was achieved.  Position of the screw was evaluated under C-arm fluoroscopy and found to be in acceptable alignment.    Due to the severity of the deformity, a distal first metatarsal osteotomy was also performed.  The capital fragment was distracted and shifted into a more corrected lateral position.  The osteotomy was fixated using an Acumed AcuTrack micro screw.  Adequate compression was achieved.  There was some residual lateral deviation of the hallux attention.  Therefore, an Akin osteotomy was performed at the base of the proximal phalanx.  The osteotomy was fixated using a 10 mm bone staple.  Adequate reduction of the deformity was then achieved.  The surgical wound was irrigated with copious amounts of sterile irrigant.  The  periosteal and capsular structures were reapproximated using 3-0 Vicryl.  The skin and subcutaneous structures were reapproximate using 4-0 Vicryl.  The skin was reapproximate using 3-0 Prolene in a running subcuticular manner.  Attention was directed the dorsal aspect of the second toe.   A linear longitudinal incision was made over the second toe and second metatarsophalangeal joint.  Dissection was continued deep down to the level of the extensor tendon.  A transverse tenotomy and capsular incision was performed at the proximal interphalangeal joint.  The extensor tendon was reflected proximally exposing the proximal phalangeal joint and second metatarsophalangeal joint.  The second toe was found to be dorsally dislocated on the second MPJ.  I was unable to reduce the second MPJ with out shortening of the second metatarsal.  A shortening osteotomy of the second metatarsal was performed proximal to the surgical neck.  The second MPJ was reduced.  The osteotomy was fixated using a 2.0 millimeter screw.  The articular surface was resected from the head of the proximal phalanx and base of the middle phalanx.  The second toe was fixated using a 0.062 inch Kirschner wire crossing the second MPJ.  The surgical wound was irrigated with copious amounts of sterile irrigant.  The extensor tendon was reapproximated using 4-0 Vicryl.  The pneumatic tourniquet was released.  The subcutaneous structures reapproximate using 4-0 Vicryl.  The skin was reapproximated using 4-0 Prolene.  Skin closure was reinforced with Steri-Strips.  A sterile compressive dressing was applied to the left foot.  He was transferred from the operating room to the postanesthesia care unit with vital signs stable and vascular status intact all digits of the left foot.

## 2022-06-19 NOTE — Anesthesia Procedure Notes (Signed)
Procedure Name: LMA Insertion Date/Time: 06/19/2022 7:41 AM  Performed by: Jonna Munro, CRNAPre-anesthesia Checklist: Patient identified, Emergency Drugs available, Suction available, Patient being monitored and Timeout performed Patient Re-evaluated:Patient Re-evaluated prior to induction Oxygen Delivery Method: Circle system utilized Preoxygenation: Pre-oxygenation with 100% oxygen Induction Type: IV induction LMA: LMA inserted LMA Size: 4.0 Number of attempts: 1 Placement Confirmation: positive ETCO2, CO2 detector and breath sounds checked- equal and bilateral Tube secured with: Tape Dental Injury: Teeth and Oropharynx as per pre-operative assessment

## 2022-06-19 NOTE — Transfer of Care (Signed)
Immediate Anesthesia Transfer of Care Note  Patient: Shane [name]  Procedure(s) Performed: CLOSING BASE WEDGE OSTEOTOMY OF FIRST METATARSAL LEFT FOOT (Left: Toe) DISTAL FIRST OSTEOTOMY WITH REMOVAL OF HARDWARE (Left: Foot) HAMMER TOE CORRECTION 2ND TOE LEFT FOOT (Left: Toe) WEIL OSTEOTOMY 2ND METARTSAL LEFT FOOT (Left: Foot)  Patient Location: PACU  Anesthesia Type:General  Level of Consciousness: awake, alert , oriented and patient cooperative  Airway & Oxygen Therapy: Patient Spontanous Breathing  Post-op Assessment: Report given to RN, Post -op Vital signs reviewed and stable and Patient moving all extremities X 4  Post vital signs: Reviewed and stable  Last Vitals:  Vitals Value Taken Time  BP 129/73 06/19/22 1032  Temp 36.4 C 06/19/22 1032  Pulse 86 06/19/22 1039  Resp 15 06/19/22 1039  SpO2 90 % 06/19/22 1039  Vitals shown include unvalidated device data.  Last Pain:  Vitals:   06/19/22 0655  TempSrc: Oral  PainSc: 0-No pain      Patients Stated Pain Goal: 8 (11/94/17 4081)  Complications: No notable events documented.

## 2022-06-19 NOTE — H&P (Signed)
HISTORY AND PHYSICAL INTERVAL NOTE:  06/19/2022  7:27 AM  Shane [name]  has presented today for surgery, with the diagnosis of hallux valgus of the left foot and hammertoe repair of the 2nd toe of the left foot.  The various methods of treatment have been discussed with the patient.  No guarantees were given.  After consideration of risks, benefits and other options for treatment, the patient has consented to surgery.  I have reviewed the patients' chart and labs.    Patient Vitals for the past 24 hrs:  BP Temp Temp src Pulse Resp SpO2 Height Weight  06/19/22 0655 139/80 98.2 F (36.8 C) Oral 69 18 96 % '5\' 10"'$  (1.778 m) 94.9 kg    A history and physical examination was performed in my office.  The patient was reexamined.  There have been no changes to this history and physical examination.  Marcheta Grammes, DPM

## 2022-06-29 ENCOUNTER — Encounter (HOSPITAL_COMMUNITY): Payer: Self-pay | Admitting: Podiatry

## 2022-07-10 ENCOUNTER — Other Ambulatory Visit: Payer: Self-pay

## 2022-07-10 MED ORDER — OLMESARTAN MEDOXOMIL 40 MG PO TABS: 40.0000 mg | ORAL_TABLET | Freq: Every day | ORAL | 2 refills | Status: DC

## 2022-09-28 ENCOUNTER — Other Ambulatory Visit: Payer: Self-pay

## 2022-09-28 DIAGNOSIS — R7989 Other specified abnormal findings of blood chemistry: Secondary | ICD-10-CM

## 2022-10-10 LAB — HEPATIC FUNCTION PANEL
ALT: 15 IU/L (ref 0–44)
AST: 15 IU/L (ref 0–40)
Albumin: 4.3 g/dL (ref 3.8–4.8)
Alkaline Phosphatase: 65 IU/L (ref 44–121)
Bilirubin Total: 0.5 mg/dL (ref 0.0–1.2)
Bilirubin, Direct: 0.15 mg/dL (ref 0.00–0.40)
Total Protein: 6.4 g/dL (ref 6.0–8.5)

## 2022-10-30 ENCOUNTER — Encounter: Payer: Self-pay | Admitting: Internal Medicine

## 2022-10-30 ENCOUNTER — Ambulatory Visit (INDEPENDENT_AMBULATORY_CARE_PROVIDER_SITE_OTHER): Payer: No Typology Code available for payment source | Admitting: Internal Medicine

## 2022-10-30 VITALS — BP 107/69 | HR 69 | Temp 97.8°F | Ht 69.0 in | Wt 219.2 lb

## 2022-10-30 DIAGNOSIS — R7989 Other specified abnormal findings of blood chemistry: Secondary | ICD-10-CM

## 2022-10-30 NOTE — Patient Instructions (Addendum)
It was good to see you again today!  I am so glad you cut back on alcohol consumption.  Discussed limiting alcohol consumption  Cutting back on caffeine consumption will likely facilitate your sleeping better  Hepatic function profile in 6 months  Take your omeprazole 40 mg every day without fail.  This medication is best taken 30 minutes before meal  Office visit in 1 year  Plan for a surveillance colonoscopy 2026; plan for screening upper endoscopy at that time .                Surveillance TCS 2026.

## 2022-10-30 NOTE — Progress Notes (Signed)
Primary Care Physician:  Redmond School, MD Primary Gastroenterologist:  Dr. Gala Romney  Pre-Procedure History & Physical: HPI:  Shane [name] is a 72 y.o. male here for follow-up of elevated aminotransferases.  Likely related to alcohol use.  There is a temporal relationship between alcohol consumption and bump in his LFTs.  He is cut way back on alcohol consumption  - currently 2 glasses of wine and 2 ounces of scotch daily.  LFTs last week completely normal.  He says his only problem nowadays he cannot sleep very well he goes to bed and wakes up at 2 AM he readily admits to drinking 5 espressos during the day when he wakes up at 2 AM he has another 1.  He tried to cut back on omeprazole but had breakthrough reflux symptoms he is back on 40 mg daily but not taking it before meal.  No dysphagia.  Previous EGD recommended but declined.  Recovering from hammertoe surgery.  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   Nasal fracture    S/P    Prostatic hypertrophy, benign    Tobacco abuse, in remission    cigars x2 a day    Past Surgical History:  Procedure Laterality Date   APPENDECTOMY     BUNIONECTOMY     COLONOSCOPY  06/30/2006   Remote polypectomy; normal rectum/left-side transverse diverticula, pedunculated polyp in left colon,  path with acutely and chronically inflammed granulation tissue with surface ulceration   COLONOSCOPY  09/01/2011   Procedure: COLONOSCOPY;  Surgeon: Daneil Dolin, MD;  Location: [facility];  Service: Endoscopy;  Laterality: N/A;  12:30   COLONOSCOPY N/A 12/24/2016   Dr. Gala Romney: 3 semi-pedunculated polyps measuring 4 to 6 mm in  size removed from the ascending colon and cecum.  Diverticulosis.  Pathology revealed tubular adenomas.  Next colonoscopy planned for April 2021.   COLONOSCOPY WITH PROPOFOL N/A 07/08/2020   Procedure: COLONOSCOPY WITH PROPOFOL;  Surgeon: Daneil Dolin, MD;  Location: [facility];  Service: Endoscopy;  Laterality: N/A;  12:15pm   HAMMER TOE SURGERY Left 06/19/2022   Procedure: HAMMER TOE CORRECTION 2ND TOE LEFT FOOT;  Surgeon: Caprice Beaver, DPM;  Location: [facility] OR[facility];  Service: Podiatry;  Laterality: Left;   HARDWARE REMOVAL Left 06/19/2022   Procedure: DISTAL FIRST OSTEOTOMY WITH REMOVAL OF HARDWARE;  Surgeon: Caprice Beaver, DPM;  Location: [facility];  Service: Podiatry;  Laterality: Left;   METATARSAL OSTEOTOMY Left 06/19/2022   Procedure: CLOSING BASE WEDGE OSTEOTOMY OF FIRST METATARSAL LEFT FOOT;  Surgeon: Caprice Beaver, DPM;  Location: [facility] OR[facility];  Service: Podiatry;  Laterality: Left;   RHINOPLASTY     WEIL OSTEOTOMY Left 06/19/2022   Procedure: WEIL OSTEOTOMY 2ND METARTSAL LEFT FOOT;  Surgeon: Caprice Beaver, DPM;  Location: [facility];  Service: Podiatry;  Laterality: Left;    Prior to Admission medications   Medication Sig Start Date End Date Taking? Authorizing Provider  amLODipine (NORVASC) 10 MG tablet Take 10 mg by mouth at bedtime.   Yes [provider]  aspirin EC 81 MG tablet Take 81 mg by mouth in  the morning.   Yes [provider]  atorvastatin (LIPITOR) 40 MG tablet Take 1 tablet (40 mg total) by mouth daily. Patient taking differently: Take 40 mg by mouth every evening. 12/16/15  Yes Branch, Alphonse Guild, MD  hyoscyamine (LEVSIN) 0.125 MG tablet Take 0.125 mg by mouth daily as needed (abdominal pain/spasms.).   Yes [provider]  ketotifen (ZADITOR) 0.025 % ophthalmic solution Place 1 drop into both eyes daily as needed (allergy eyes.).   Yes [provider]  labetalol (NORMODYNE) 100 MG tablet Take 2 tablets (200 mg) each morning and 3  tablets (300 mg) each evening. 03/13/22  Yes Warren Lacy, PA-C  loratadine (CLARITIN) 10 MG tablet Take 10 mg by mouth daily as needed for allergies.   Yes [provider]  MINOXIDIL, TOPICAL, 5 % SOLN Apply 1 application  topically in the morning and at bedtime. Applied to scalp   Yes [provider]  Multiple Vitamin (MULTIVITAMIN) capsule Take 1 capsule by mouth in the morning.   Yes [provider]  naproxen sodium (ALEVE) 220 MG tablet Take 220 mg by mouth 2 (two) times daily as needed (pain).   Yes [provider]  olmesartan (BENICAR) 40 MG tablet Take 1 tablet (40 mg total) by mouth daily. 07/10/22  Yes Lorretta Harp, MD  omeprazole (PRILOSEC) 40 MG capsule Take 40 mg by mouth daily before breakfast.   Yes [provider]    Allergies as of 10/30/2022   (No Known Allergies)    Family History  Problem Relation Age of Onset   Heart attack Father    Colon cancer Neg Hx     Social History   Socioeconomic History   Marital status: Widowed    Spouse name: Not on file   Number of children: Not on file   Years of education: Not on file   Highest education level: Not on file  Occupational History   Occupation: retired    Fish farm manager: [employer]    Comment: post office  Tobacco Use   Smoking status: Every Day    Types: Cigars   Smokeless tobacco: Never   Tobacco comments:    2 cigars daily  Substance and Sexual Activity   Alcohol use: Yes    Alcohol/week: 14.0 standard drinks of alcohol    Types: 7 Glasses of wine, 7 Shots of liquor per week    Comment: 2 glasses of wine every night and glass of bourbon   Drug use: No   Sexual activity: Yes  Other Topics Concern   Not on file  Social History Narrative   Not on file   Social Determinants of Health   Financial Resource Strain: Not on file  Food Insecurity: Not on file  Transportation Needs: Not on file  Physical Activity: Not on file  Stress: Not on file   Social Connections: Not on file  Intimate Partner Violence: Not on file    Review of Systems: See HPI, otherwise negative ROS  Physical Exam: BP 107/69 (BP Location: Right Arm, Patient Position: Sitting, Cuff Size: Large)   Pulse 69   Temp 97.8 F (36.6 C) (Oral)   Ht 5' 9"$  (1.753 m)   Wt 219 lb 3.2 oz (99.4 kg)   SpO2 95%   BMI 32.37 kg/m  General:   Alert,  Well-developed, well-nourished, pleasant and cooperative in NAD  Impression/Plan: Very pleasant 72 year old gentleman with low-grade alcohol use liver injury.  Marked decrease in alcohol consumption associate  with normalization of LFTs.  We discussed the effects of alcohol on the liver.  GERD well-controlled on omeprazole 40 mg daily no alarm symptoms.  It has been recommended he undergo a screening EGD.  He wants to hold off.  He states that he be willing to have one in 2026 at the same time as his surveillance colonoscopy  Caffeine stream excess.  Likely contributing to insomnia.   Recommendations:  Discussed limiting alcohol consumption  Cutting back on caffeine consumption will likely facilitate sleeping better  Hepatic function profile in 6 months  Take omeprazole 40 mg every day without fail.  This medication is best taken 30 minutes before meal  Office visit in 1 year  Plan for a surveillance colonoscopy 2026; plan for screening upper endoscopy at that time .      Notice: This dictation was prepared with Dragon dictation along with smaller phrase technology. Any transcriptional errors that result from this process are unintentional and may not be corrected upon review.

## 2022-11-17 IMAGING — MR MR 3D RECON AT [facility]
22 of 23 series · 45 of 48 positions shown · IV contrast (10 ml Gadavist)
Comparison: No prior abdominal MRI. Abdominal ultrasound
12/30/2021.

CLINICAL DATA: 70-year-old male with history of abnormal liver
function tests and dilated bile duct.

EXAM:
MRI ABDOMEN WITHOUT AND WITH CONTRAST (INCLUDING MRCP)
TECHNIQUE: Multiplanar multisequence MR imaging of the abdomen was performed
both before and after the administration of intravenous contrast.
Heavily T2-weighted images of the biliary and pancreatic ducts were
obtained, and three-dimensional MRCP images were rendered by post
processing.
CONTRAST:  10mL GADAVIST GADOBUTROL 1 MMOL/ML IV SOLN

[Series 100: cor haste · coronal · 6.0mm · 1.25mm/px · 1 of 34 slices shown]
[im 1/34]
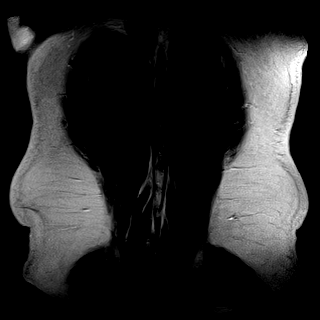

[Series 101: T2 fat-sat · axial · 6.0mm · 1.25mm/px · 1 of 35 slices shown]
[im 1/35]
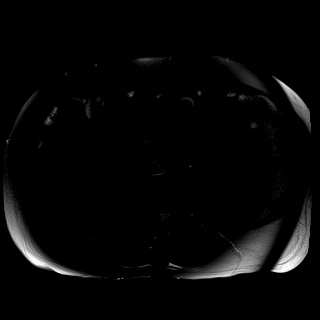

[Series 102: DWI · axial · 6.0mm · 1.49mm/px · 1 of 35 slices shown (1 of 4)]
[im 1/35]
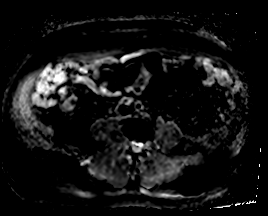

[Series 103: DWI · axial · 6.0mm · 1.49mm/px · 1 of 35 slices shown (2 of 4)]
[im 1/35]
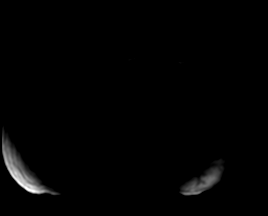

[Series 103: DWI · axial · 6.0mm · 1.49mm/px · 1 of 35 slices shown (3 of 4)]
[im 1/35]
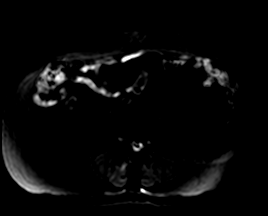

[Series 103: DWI · axial · 6.0mm · 1.49mm/px · 1 of 35 slices shown (4 of 4)]
[im 1/35]
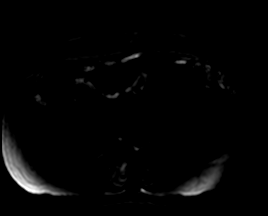

[Series 104: ax in and · axial · 3.0mm · 1.25mm/px · z∈[-133,+104]mm · 2 of 80 slices shown (1 of 2)]
[im 1/80]
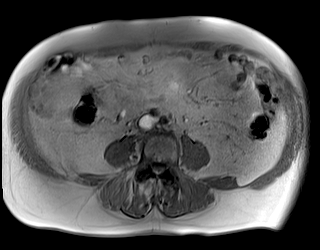
[im 80/80]
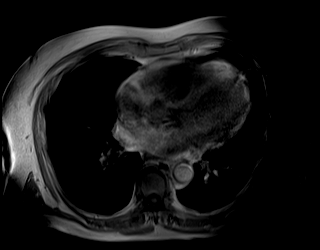

[Series 105: ax in and · axial · 3.0mm · 1.25mm/px · z∈[-133,+104]mm · 3 of 80 slices shown (2 of 2)]
[im 1/80]
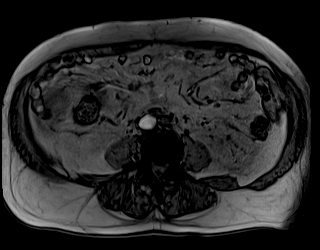
[im 40/80]
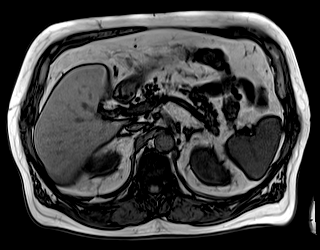
[im 80/80]
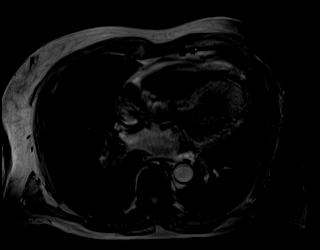

[Series 106: ax haste bh · axial · 6.0mm · 1.25mm/px · 1 of 35 slices shown]
[im 1/35]
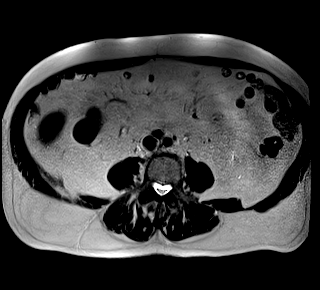

[Series 108: MRCP · coronal · 50.0mm · 0.78mm/px · 1 of 5 slices shown (1 of 3)]
[im 1/5]
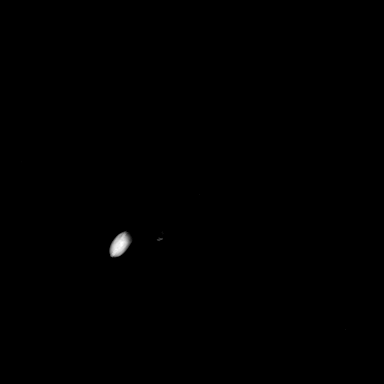

[Series 109: MRCP · coronal · 0.6mm · 0.29mm/px · 1 of 8 slices shown (2 of 3)]
[im 1/8]
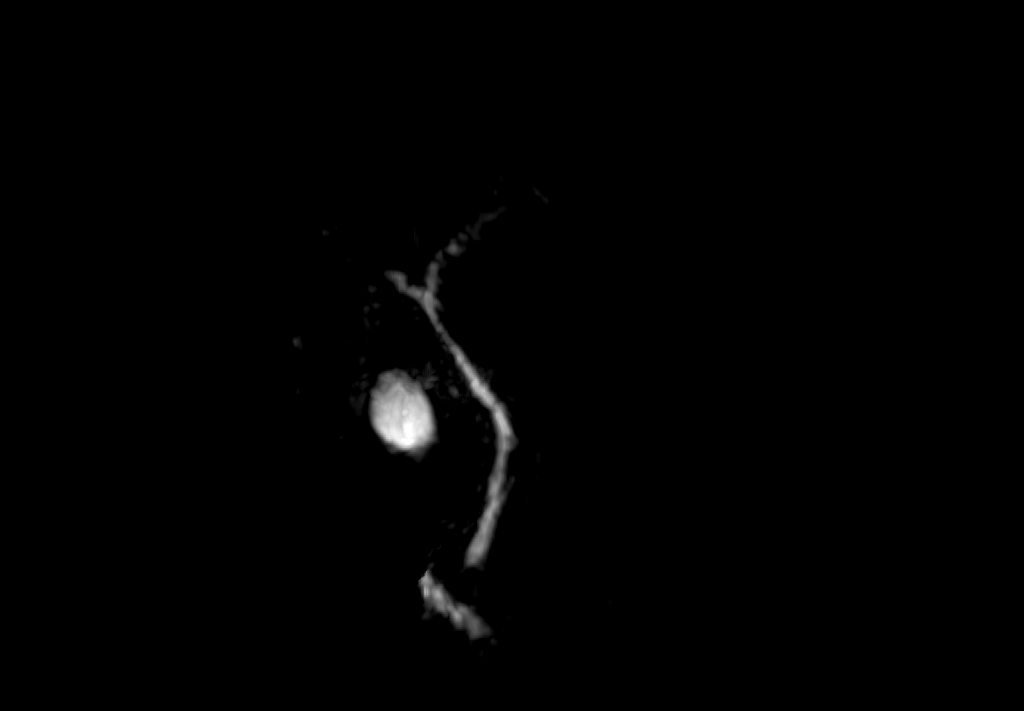

[Series 110: MRCP · coronal · 4.0mm · 1.12mm/px · 1 of 19 slices shown (3 of 3)]
[im 1/19]
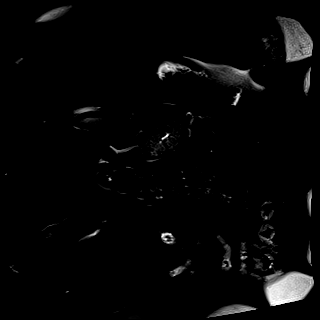

[Series 111: T1 dynamic · axial · 3.0mm · 1.25mm/px · z∈[-148,+113]mm · 3 of 88 slices shown]
[im 1/88]
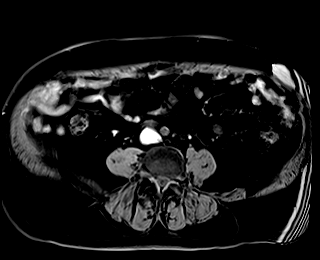
[im 44/88]
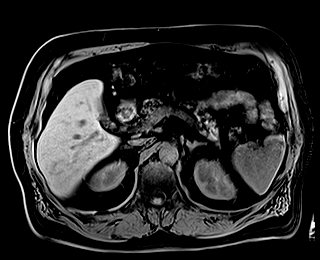
[im 88/88]
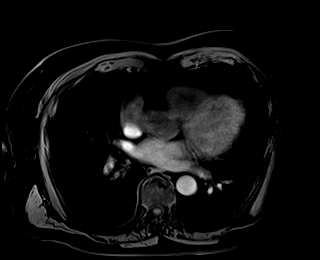

[Series 112: T1 dynamic post-contrast · axial · 3.0mm · 1.25mm/px · z∈[-148,+113]mm · 3 of 88 slices shown (1 of 9)]
[im 1/88]
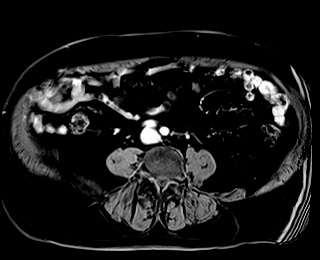
[im 44/88]
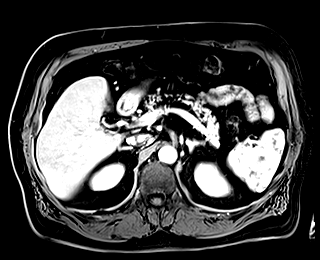
[im 88/88]
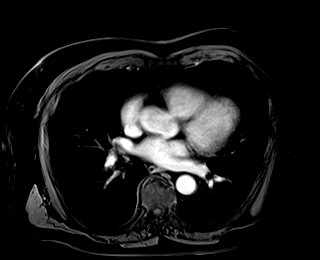

[Series 113: T1 dynamic post-contrast · axial · 3.0mm · 1.25mm/px · z∈[-148,+113]mm · 3 of 88 slices shown (2 of 9)]
[im 1/88]
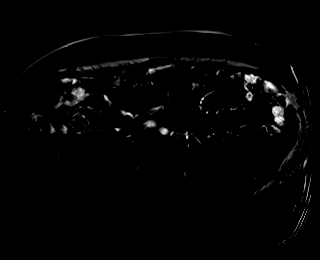
[im 44/88]
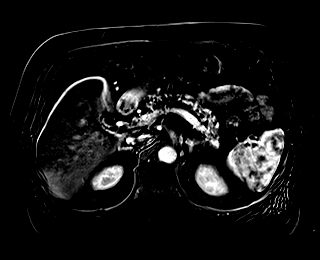
[im 88/88]
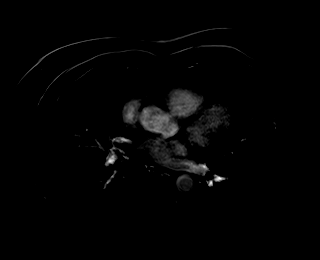

[Series 114: T1 dynamic post-contrast · axial · 3.0mm · 1.25mm/px · z∈[-148,+113]mm · 3 of 88 slices shown (3 of 9)]
[im 1/88]
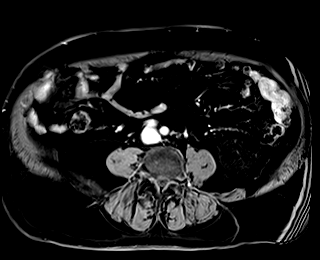
[im 44/88]
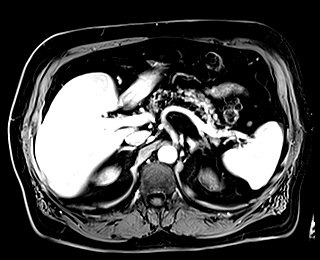
[im 88/88]
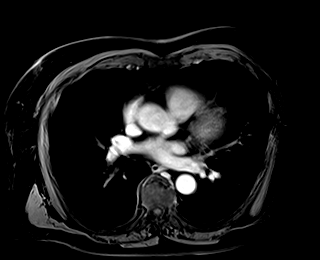

[Series 115: T1 dynamic post-contrast · axial · 3.0mm · 1.25mm/px · z∈[-148,+113]mm · 3 of 88 slices shown (4 of 9)]
[im 1/88]
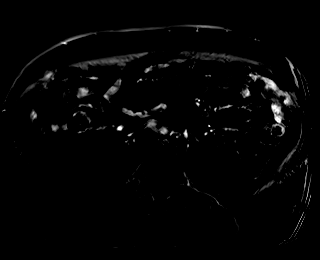
[im 44/88]
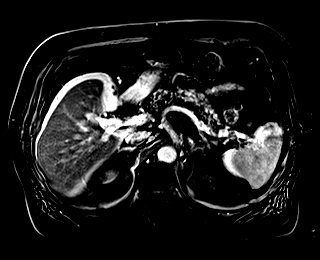
[im 88/88]
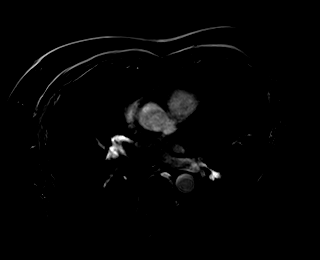

[Series 116: T1 dynamic post-contrast · axial · 3.0mm · 1.25mm/px · z∈[-148,+113]mm · 3 of 88 slices shown (5 of 9)]
[im 1/88]
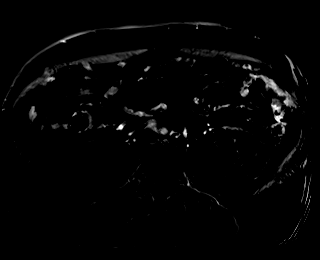
[im 44/88]
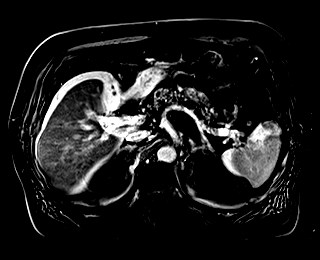
[im 88/88]
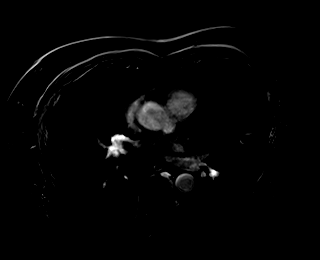

[Series 117: T1 dynamic post-contrast · axial · 3.0mm · 1.25mm/px · z∈[-148,+113]mm · 3 of 88 slices shown (6 of 9)]
[im 1/88]
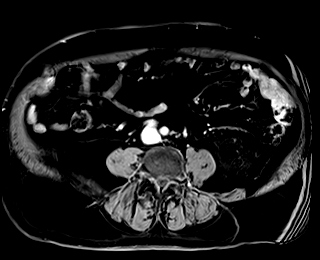
[im 44/88]
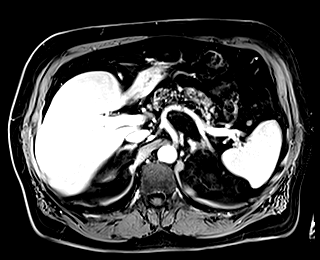
[im 88/88]
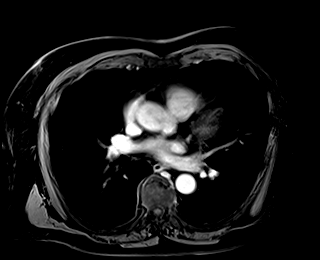

[Series 118: T1 dynamic post-contrast · coronal · 3.0mm · 1.31mm/px · 3 of 72 slices shown (7 of 9)]
[im 1/72]
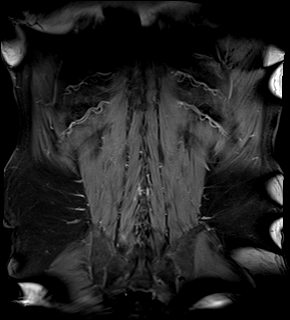
[im 36/72]
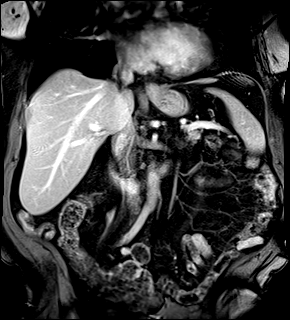
[im 72/72]
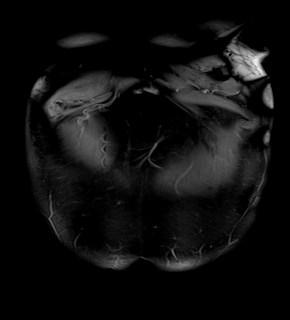

[Series 119: T1 dynamic post-contrast · axial · 3.0mm · 1.25mm/px · z∈[-148,+113]mm · 3 of 88 slices shown (8 of 9)]
[im 1/88]
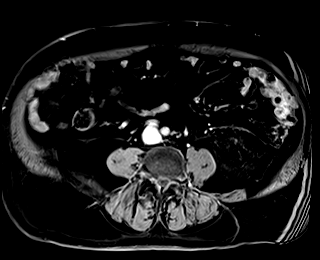
[im 44/88]
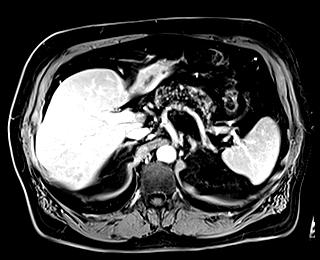
[im 88/88]
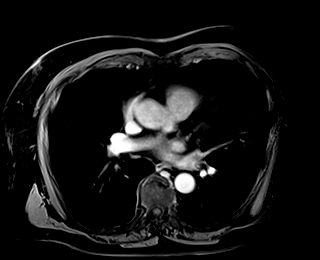

[Series 120: T1 dynamic post-contrast · axial · 3.0mm · 1.25mm/px · z∈[-148,+113]mm · 3 of 88 slices shown (9 of 9)]
[im 1/88]
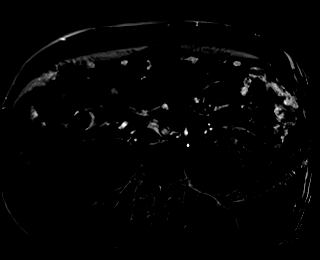
[im 44/88]
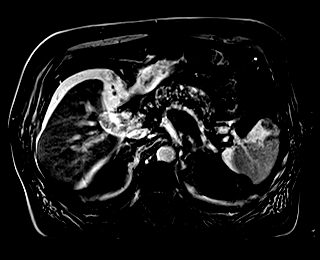
[im 88/88]
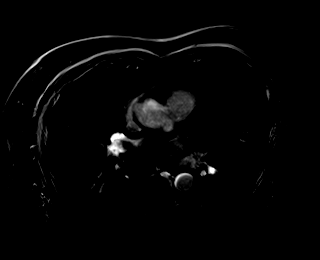

[45 of 48 positions shown; findings below may reference images not displayed]

FINDINGS: Lower chest: Unremarkable.

Hepatobiliary: Mild diffuse loss of signal intensity throughout the
hepatic parenchyma on out of phase dual echo images, indicative of a
background of mild hepatic steatosis. No suspicious cystic or solid
hepatic lesions. No intra or extrahepatic biliary ductal dilatation
noted on MRCP images. Common bile duct measures only 3 mm in the
porta hepatis. No filling defect in the common bile duct to suggest
choledocholithiasis. Gallbladder is normal in appearance.

Pancreas: No pancreatic mass. No pancreatic ductal dilatation noted
on MRCP images. No pancreatic or peripancreatic fluid collections or
inflammatory changes.

Spleen:  Unremarkable.

Adrenals/Urinary Tract: Exophytic 3.5 cm T1 hypointense, T2
hyperintense, nonenhancing lesion in the posterior aspect of the
interpolar region of the right kidney, compatible with a simple
(Bosniak class 1) cyst (no imaging follow-up recommended). Left
kidney and bilateral adrenal glands are normal in appearance. No
hydroureteronephrosis in the visualized portions of the abdomen.

Stomach/Bowel: Numerous colonic diverticulae are noted. Otherwise,
unremarkable.

Vascular/Lymphatic: Aortic atherosclerosis, without evidence of
aneurysm in the visualized abdominal vasculature. No lymphadenopathy
noted in the abdomen.

Other: No significant volume of ascites noted in the visualized
portions of the peritoneal cavity.

Musculoskeletal: No aggressive appearing osseous lesions are noted
in the visualized portions of the skeleton.
IMPRESSION: 1. Today's study demonstrates no evidence of biliary tract
obstruction.
2. Mild hepatic steatosis.
3. Colonic diverticulosis.
4. 3.5 cm simple (Bosniak class 1) cyst in the posterior aspect of
the interpolar region of the right kidney incidentally noted. No
imaging follow-up is recommended.

## 2023-04-12 ENCOUNTER — Other Ambulatory Visit: Payer: Self-pay

## 2023-04-12 DIAGNOSIS — R7989 Other specified abnormal findings of blood chemistry: Secondary | ICD-10-CM

## 2023-04-22 NOTE — Progress Notes (Signed)
Pt will be going on 04/30/2023

## 2023-04-27 ENCOUNTER — Other Ambulatory Visit: Payer: Self-pay

## 2023-04-27 DIAGNOSIS — K760 Fatty (change of) liver, not elsewhere classified: Secondary | ICD-10-CM

## 2023-04-27 DIAGNOSIS — R7989 Other specified abnormal findings of blood chemistry: Secondary | ICD-10-CM

## 2023-05-23 ENCOUNTER — Other Ambulatory Visit: Payer: Self-pay | Admitting: Cardiovascular Disease

## 2023-05-23 ENCOUNTER — Other Ambulatory Visit: Payer: Self-pay | Admitting: Physician Assistant

## 2023-10-04 ENCOUNTER — Encounter: Payer: Self-pay | Admitting: Internal Medicine

## 2024-03-09 ENCOUNTER — Other Ambulatory Visit: Payer: Self-pay | Admitting: Cardiovascular Disease

## 2024-03-13 ENCOUNTER — Other Ambulatory Visit: Payer: Self-pay | Admitting: Cardiovascular Disease

## 2024-04-10 ENCOUNTER — Other Ambulatory Visit: Payer: Self-pay | Admitting: Cardiovascular Disease

## 2024-04-10 ENCOUNTER — Encounter: Payer: Self-pay | Admitting: Cardiovascular Disease

## 2024-04-10 ENCOUNTER — Ambulatory Visit: Payer: Self-pay | Attending: Cardiovascular Disease | Admitting: Cardiovascular Disease

## 2024-04-10 VITALS — BP 110/68 | HR 66 | Ht 69.5 in | Wt 220.4 lb

## 2024-04-10 DIAGNOSIS — E782 Mixed hyperlipidemia: Secondary | ICD-10-CM | POA: Diagnosis not present

## 2024-04-10 DIAGNOSIS — I401 Isolated myocarditis: Secondary | ICD-10-CM | POA: Diagnosis not present

## 2024-04-10 DIAGNOSIS — I10 Essential (primary) hypertension: Secondary | ICD-10-CM | POA: Diagnosis not present

## 2024-04-10 DIAGNOSIS — F17201 Nicotine dependence, unspecified, in remission: Secondary | ICD-10-CM

## 2024-04-10 NOTE — Patient Instructions (Signed)

## 2024-04-10 NOTE — Assessment & Plan Note (Signed)
 History of essential hypertension with blood pressure measured today at 110/68.  He is on amlodipine , labetalol  and olmesartan .

## 2024-04-10 NOTE — Assessment & Plan Note (Signed)
 Still smokes 1 to 2 cigars a day.

## 2024-04-10 NOTE — Assessment & Plan Note (Signed)
 History of hyperlipidemia on statin therapy with lipid profile performed 03/22/2024 revealing total cholesterol 147, LDL of 90 and HDL of 40.

## 2024-04-10 NOTE — Progress Notes (Signed)
 04/10/2024 Shane [name]   Feb 23, 1951  [id number]  Primary Physician Shona Arthea DASEN, DO Primary Cardiologist: Dorn PARAS Lesches MD GENI CODY MADEIRA, FSCAI  HPI:  Shane [name] is a 73 y.o.   mildly overweight widowed Caucasian male father 2, grandfather 2 grandchildren referred by Dr. Bertell for cardiovascular valuation because of hypertension. 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  I last saw him in the office 01/31/2021.  This is factors include tobacco abuse hyperlipidemia. There is no family history Lesches is never had a heart attack or stroke. He denies chest pain but does get short of breath on occasion. He had an acute pericarditis in his early 9s and again in his early 63s and apparently had a normal cath in [place]  at that time. He saw Dr. Dorn Ross in [place] 5 years ago who mentioned that he had hypertension but was on no medications at that time.   Since I saw him in the office 3 years ago he continues to do well.  Blood pressures have been under good control.  He denies chest pain or shortness of breath.    Current Meds  Medication Sig   amLODipine  (NORVASC ) 10 MG tablet Take 10 mg by mouth at bedtime.   aspirin EC 81 MG tablet Take 81 mg by mouth in the morning.   atorvastatin  (LIPITOR) 40 MG tablet Take 1 tablet (40 mg total) by mouth daily. (Patient taking differently: Take 40 mg by mouth every evening.)   doxycycline (VIBRAMYCIN) 50 MG capsule Take 50 mg by mouth daily.   hyoscyamine (LEVSIN) 0.125 MG tablet Take 0.125 mg by mouth daily as needed (abdominal pain/spasms.).   ketotifen (ZADITOR) 0.025 % ophthalmic solution Place 1 drop into both eyes daily as needed (allergy eyes.).   labetalol  (NORMODYNE ) 100 MG tablet TAKE 2 TABLETS BY MOUTH EVERY MORNING, THEN 3 TABLETS EVERY EVENING   loratadine (CLARITIN) 10 MG tablet Take 10 mg by mouth daily as needed for allergies.   MINOXIDIL, TOPICAL, 5 % SOLN Apply 1 application  topically  in the morning and at bedtime. Applied to scalp   Multiple Vitamin (MULTIVITAMIN) capsule Take 1 capsule by mouth in the morning.   mupirocin cream (BACTROBAN) 2 % Apply 1 Application topically as needed.   naproxen sodium (ALEVE) 220 MG tablet Take 220 mg by mouth 2 (two) times daily as needed (pain).   olmesartan  (BENICAR ) 40 MG tablet TAKE 1 TABLET(40 MG) BY MOUTH DAILY   omeprazole (PRILOSEC) 40 MG capsule Take 40 mg by mouth daily before breakfast.     No Known Allergies  Social History   Socioeconomic History   Marital status: Widowed    Spouse name: Not on file   Number of children: Not on file   Years of education: Not on file   Highest education level: Not on file  Occupational History   Occupation: retired    Associate Professor: [employer]    Comment: [employer]  Tobacco Use   Smoking status: Every Day    Types: Cigars   Smokeless tobacco: Never   Tobacco comments:    2 cigars daily  Substance and Sexual Activity   Alcohol use: Yes    Alcohol/week: 14.0 standard drinks of alcohol    Types: 7 Glasses of wine, 7 Shots of liquor per week    Comment: 2 glasses of wine every night and glass of bourbon   Drug use: No  Sexual activity: Yes  Other Topics Concern   Not on file  Social History Narrative   Not on file   Social Drivers of Health   Financial Resource Strain: Not on file  Food Insecurity: Not on file  Transportation Needs: Not on file  Physical Activity: Not on file  Stress: Not on file  Social Connections: Not on file  Intimate Partner Violence: Not on file     Review of Systems: General: negative for chills, fever, night sweats or weight changes.  Cardiovascular: negative for chest pain, dyspnea on exertion, edema, orthopnea, palpitations, paroxysmal nocturnal dyspnea or shortness of breath Dermatological: negative for rash Respiratory: negative for cough or wheezing Urologic: negative for hematuria Abdominal: negative for nausea, vomiting,  diarrhea, bright red blood per rectum, melena, or hematemesis Neurologic: negative for visual changes, syncope, or dizziness All other systems reviewed and are otherwise negative except as noted above.    Blood pressure 110/68, pulse 66, height 5' 9.5 (1.765 m), weight 220 lb 6.4 oz (100 kg), SpO2 95%.  General appearance: alert and no distress Neck: no adenopathy, no carotid bruit, no JVD, supple, symmetrical, trachea midline, and thyroid  not enlarged, symmetric, no tenderness/mass/nodules Lungs: clear to auscultation bilaterally Heart: regular rate and rhythm, S1, S2 normal, no murmur, click, rub or gallop Extremities: extremities normal, atraumatic, no cyanosis or edema Pulses: 2+ and symmetric Skin: Skin color, texture, turgor normal. No rashes or lesions Neurologic: Grossly normal  EKG EKG Interpretation Date/Time:  Monday April 10 2024 13:31:20 EDT Ventricular Rate:  66 PR Interval:  164 QRS Duration:  96 QT Interval:  450 QTC Calculation: 471 R Axis:   -22  Text Interpretation: Normal sinus rhythm Minimal voltage criteria for LVH, may be normal variant ( R in aVL ) Anterior infarct (cited on or before 05-Jul-2020) When compared with ECG of 05-Jul-2020 08:57, Questionable change in initial forces of Anterior leads Nonspecific T wave abnormality now evident in Anterior leads Confirmed by Court Carrier [phone]) on 04/10/2024 1:39:58 PM    ASSESSMENT AND PLAN:   Hyperlipidemia History of hyperlipidemia on statin therapy with lipid profile performed 03/22/2024 revealing total cholesterol 147, LDL of 90 and HDL of 40.  Tobacco abuse, in remission Still smokes 1 to 2 cigars a day.  Hypertension History of essential hypertension with blood pressure measured today at 110/68.  He is on amlodipine , labetalol  and olmesartan .     Carrier DOROTHA Court MD [facility], [facility] 04/10/2024 1:46 PM

## 2024-04-11 ENCOUNTER — Other Ambulatory Visit: Payer: Self-pay

## 2024-04-11 DIAGNOSIS — R7989 Other specified abnormal findings of blood chemistry: Secondary | ICD-10-CM

## 2024-04-11 DIAGNOSIS — K760 Fatty (change of) liver, not elsewhere classified: Secondary | ICD-10-CM

## 2024-04-15 LAB — HEPATIC FUNCTION PANEL
ALT: 29 IU/L (ref 0–44)
AST: 19 IU/L (ref 0–40)
Albumin: 4 g/dL (ref 3.8–4.8)
Alkaline Phosphatase: 55 IU/L (ref 44–121)
Bilirubin Total: 0.4 mg/dL (ref 0.0–1.2)
Bilirubin, Direct: 0.15 mg/dL (ref 0.00–0.40)
Total Protein: 6 g/dL (ref 6.0–8.5)

## 2024-04-16 ENCOUNTER — Ambulatory Visit: Payer: Self-pay | Admitting: Internal Medicine

## 2024-05-31 ENCOUNTER — Other Ambulatory Visit: Payer: Self-pay | Admitting: Cardiovascular Disease

## 2024-05-31 MED ORDER — LABETALOL HCL 100 MG PO TABS: ORAL_TABLET | ORAL | 3 refills | Status: AC
